# Patient Record
Sex: Female | Born: 1975
Health system: Southern US, Community
[De-identification: ages and names within clinical notes are randomized; demographics above are authoritative.]

## PROBLEM LIST (undated history)

## (undated) DIAGNOSIS — D649 Anemia, unspecified: Secondary | ICD-10-CM

## (undated) DIAGNOSIS — N951 Menopausal and female climacteric states: Secondary | ICD-10-CM

## (undated) DIAGNOSIS — G5603 Carpal tunnel syndrome, bilateral upper limbs: Secondary | ICD-10-CM

## (undated) DIAGNOSIS — E119 Type 2 diabetes mellitus without complications: Secondary | ICD-10-CM

## (undated) DIAGNOSIS — E7800 Pure hypercholesterolemia, unspecified: Secondary | ICD-10-CM

## (undated) HISTORY — DX: Carpal tunnel syndrome, bilateral upper limbs: G56.03

## (undated) HISTORY — DX: Menopausal and female climacteric states: N95.1

## (undated) HISTORY — PX: TUBAL LIGATION: SHX77

## (undated) HISTORY — DX: Pure hypercholesterolemia, unspecified: E78.00

---

## 2005-03-04 ENCOUNTER — Inpatient Hospital Stay (HOSPITAL_COMMUNITY): Admission: AD | Admit: 2005-03-04 | Discharge: 2005-03-04 | Payer: Self-pay | Admitting: Obstetrics

## 2005-03-23 ENCOUNTER — Inpatient Hospital Stay (HOSPITAL_COMMUNITY): Admission: AD | Admit: 2005-03-23 | Discharge: 2005-03-23 | Payer: Self-pay | Admitting: Obstetrics

## 2005-03-26 ENCOUNTER — Inpatient Hospital Stay (HOSPITAL_COMMUNITY): Admission: AD | Admit: 2005-03-26 | Discharge: 2005-03-29 | Payer: Self-pay | Admitting: Obstetrics

## 2006-10-05 ENCOUNTER — Ambulatory Visit: Payer: Self-pay | Admitting: Family Medicine

## 2007-01-05 ENCOUNTER — Ambulatory Visit: Payer: Self-pay | Admitting: Family Medicine

## 2007-09-12 ENCOUNTER — Ambulatory Visit: Payer: Self-pay | Admitting: Family Medicine

## 2007-12-18 ENCOUNTER — Inpatient Hospital Stay (HOSPITAL_COMMUNITY): Admission: AD | Admit: 2007-12-18 | Discharge: 2007-12-19 | Payer: Self-pay | Admitting: Obstetrics & Gynecology

## 2008-01-05 ENCOUNTER — Ambulatory Visit: Payer: Self-pay | Admitting: Obstetrics and Gynecology

## 2008-02-16 ENCOUNTER — Ambulatory Visit: Payer: Self-pay | Admitting: Obstetrics and Gynecology

## 2008-10-30 ENCOUNTER — Encounter (INDEPENDENT_AMBULATORY_CARE_PROVIDER_SITE_OTHER): Payer: Self-pay | Admitting: Family Medicine

## 2008-10-30 ENCOUNTER — Ambulatory Visit: Payer: Self-pay | Admitting: Family Medicine

## 2008-11-06 ENCOUNTER — Ambulatory Visit: Payer: Self-pay | Admitting: *Deleted

## 2009-03-16 ENCOUNTER — Inpatient Hospital Stay (HOSPITAL_COMMUNITY): Admission: AD | Admit: 2009-03-16 | Discharge: 2009-03-16 | Payer: Self-pay | Admitting: Obstetrics and Gynecology

## 2009-04-17 ENCOUNTER — Ambulatory Visit: Payer: Self-pay | Admitting: Obstetrics and Gynecology

## 2009-04-17 LAB — CONVERTED CEMR LAB: Anticardiolipin IgG: 9 (ref <10)

## 2010-04-07 ENCOUNTER — Ambulatory Visit: Payer: Self-pay | Admitting: Internal Medicine

## 2010-05-29 ENCOUNTER — Ambulatory Visit (HOSPITAL_COMMUNITY)
Admission: RE | Admit: 2010-05-29 | Discharge: 2010-05-29 | Payer: Self-pay | Source: Home / Self Care | Attending: Family Medicine | Admitting: Family Medicine

## 2010-08-04 ENCOUNTER — Encounter: Payer: Self-pay | Attending: Obstetrics & Gynecology | Admitting: Dietician

## 2010-08-04 ENCOUNTER — Encounter: Payer: Self-pay | Admitting: Obstetrics & Gynecology

## 2010-08-04 ENCOUNTER — Other Ambulatory Visit: Payer: Self-pay

## 2010-08-04 ENCOUNTER — Other Ambulatory Visit: Payer: Self-pay | Admitting: Obstetrics & Gynecology

## 2010-08-04 DIAGNOSIS — O0930 Supervision of pregnancy with insufficient antenatal care, unspecified trimester: Secondary | ICD-10-CM

## 2010-08-04 DIAGNOSIS — O99810 Abnormal glucose complicating pregnancy: Secondary | ICD-10-CM | POA: Insufficient documentation

## 2010-08-04 DIAGNOSIS — Z713 Dietary counseling and surveillance: Secondary | ICD-10-CM | POA: Insufficient documentation

## 2010-08-04 DIAGNOSIS — O24919 Unspecified diabetes mellitus in pregnancy, unspecified trimester: Secondary | ICD-10-CM

## 2010-08-04 DIAGNOSIS — O09529 Supervision of elderly multigravida, unspecified trimester: Secondary | ICD-10-CM

## 2010-08-04 DIAGNOSIS — O34219 Maternal care for unspecified type scar from previous cesarean delivery: Secondary | ICD-10-CM

## 2010-08-04 LAB — CONVERTED CEMR LAB: Hgb A1c MFr Bld: 5.3 % (ref <5.7)

## 2010-08-04 LAB — POCT URINALYSIS DIPSTICK
Bilirubin Urine: NEGATIVE
Hgb urine dipstick: NEGATIVE
Protein, ur: 30 mg/dL — AB

## 2010-08-05 ENCOUNTER — Encounter: Payer: Self-pay | Admitting: Obstetrics & Gynecology

## 2010-08-06 ENCOUNTER — Encounter (HOSPITAL_COMMUNITY): Payer: Self-pay

## 2010-08-06 ENCOUNTER — Ambulatory Visit (HOSPITAL_COMMUNITY)
Admission: RE | Admit: 2010-08-06 | Discharge: 2010-08-06 | Disposition: A | Discharge status: Home / Self Care | Payer: Self-pay | Source: Ambulatory Visit | Attending: Obstetrics & Gynecology | Admitting: Obstetrics & Gynecology

## 2010-08-06 DIAGNOSIS — O24919 Unspecified diabetes mellitus in pregnancy, unspecified trimester: Secondary | ICD-10-CM

## 2010-08-06 DIAGNOSIS — O99810 Abnormal glucose complicating pregnancy: Secondary | ICD-10-CM | POA: Insufficient documentation

## 2010-08-06 DIAGNOSIS — O3660X0 Maternal care for excessive fetal growth, unspecified trimester, not applicable or unspecified: Secondary | ICD-10-CM | POA: Insufficient documentation

## 2010-08-11 ENCOUNTER — Other Ambulatory Visit: Payer: Self-pay

## 2010-08-11 ENCOUNTER — Other Ambulatory Visit: Payer: Self-pay | Admitting: Family Medicine

## 2010-08-11 DIAGNOSIS — O09529 Supervision of elderly multigravida, unspecified trimester: Secondary | ICD-10-CM

## 2010-08-11 DIAGNOSIS — O99810 Abnormal glucose complicating pregnancy: Secondary | ICD-10-CM

## 2010-08-11 DIAGNOSIS — O350XX0 Maternal care for (suspected) central nervous system malformation in fetus, not applicable or unspecified: Secondary | ICD-10-CM

## 2010-08-11 DIAGNOSIS — O0930 Supervision of pregnancy with insufficient antenatal care, unspecified trimester: Secondary | ICD-10-CM

## 2010-08-11 DIAGNOSIS — O34219 Maternal care for unspecified type scar from previous cesarean delivery: Secondary | ICD-10-CM

## 2010-08-11 LAB — POCT URINALYSIS DIPSTICK
Glucose, UA: NEGATIVE mg/dL
Protein, ur: 30 mg/dL — AB
Specific Gravity, Urine: >=1.03 (ref 1.005–1.030)
Urobilinogen, UA: 0.2 mg/dL (ref 0.0–1.0)
pH: 6 (ref 5.0–8.0)

## 2010-08-18 ENCOUNTER — Ambulatory Visit (HOSPITAL_COMMUNITY): Payer: Self-pay

## 2010-08-18 ENCOUNTER — Other Ambulatory Visit: Payer: Self-pay

## 2010-08-18 DIAGNOSIS — O0930 Supervision of pregnancy with insufficient antenatal care, unspecified trimester: Secondary | ICD-10-CM

## 2010-08-18 DIAGNOSIS — O99810 Abnormal glucose complicating pregnancy: Secondary | ICD-10-CM

## 2010-08-18 DIAGNOSIS — O350XX0 Maternal care for (suspected) central nervous system malformation in fetus, not applicable or unspecified: Secondary | ICD-10-CM

## 2010-08-18 DIAGNOSIS — O34219 Maternal care for unspecified type scar from previous cesarean delivery: Secondary | ICD-10-CM

## 2010-08-18 LAB — POCT URINALYSIS DIPSTICK
Glucose, UA: NEGATIVE mg/dL
Hgb urine dipstick: NEGATIVE
Ketones, ur: NEGATIVE mg/dL
Nitrite: NEGATIVE

## 2010-08-25 ENCOUNTER — Other Ambulatory Visit: Payer: Self-pay | Admitting: Family Medicine

## 2010-08-25 ENCOUNTER — Ambulatory Visit (HOSPITAL_COMMUNITY)
Admission: RE | Admit: 2010-08-25 | Discharge: 2010-08-25 | Disposition: A | Discharge status: Home / Self Care | Payer: Self-pay | Source: Ambulatory Visit | Attending: Family Medicine | Admitting: Family Medicine

## 2010-08-25 ENCOUNTER — Encounter: Payer: Self-pay | Attending: Obstetrics & Gynecology | Admitting: Dietician

## 2010-08-25 ENCOUNTER — Other Ambulatory Visit: Payer: Self-pay

## 2010-08-25 DIAGNOSIS — O99810 Abnormal glucose complicating pregnancy: Secondary | ICD-10-CM

## 2010-08-25 DIAGNOSIS — O3660X0 Maternal care for excessive fetal growth, unspecified trimester, not applicable or unspecified: Secondary | ICD-10-CM | POA: Insufficient documentation

## 2010-08-25 DIAGNOSIS — Z713 Dietary counseling and surveillance: Secondary | ICD-10-CM | POA: Insufficient documentation

## 2010-08-25 DIAGNOSIS — Z3689 Encounter for other specified antenatal screening: Secondary | ICD-10-CM | POA: Insufficient documentation

## 2010-08-25 DIAGNOSIS — O34219 Maternal care for unspecified type scar from previous cesarean delivery: Secondary | ICD-10-CM

## 2010-08-25 LAB — POCT URINALYSIS DIP (DEVICE)
Bilirubin Urine: NEGATIVE
Glucose, UA: NEGATIVE mg/dL
Ketones, ur: NEGATIVE mg/dL
Nitrite: NEGATIVE
Urobilinogen, UA: 0.2 mg/dL (ref 0.0–1.0)

## 2010-09-01 ENCOUNTER — Other Ambulatory Visit: Payer: Self-pay | Admitting: Obstetrics and Gynecology

## 2010-09-01 DIAGNOSIS — O0930 Supervision of pregnancy with insufficient antenatal care, unspecified trimester: Secondary | ICD-10-CM

## 2010-09-01 DIAGNOSIS — O09529 Supervision of elderly multigravida, unspecified trimester: Secondary | ICD-10-CM

## 2010-09-01 DIAGNOSIS — O99810 Abnormal glucose complicating pregnancy: Secondary | ICD-10-CM

## 2010-09-01 DIAGNOSIS — O34219 Maternal care for unspecified type scar from previous cesarean delivery: Secondary | ICD-10-CM

## 2010-09-01 DIAGNOSIS — Z331 Pregnant state, incidental: Secondary | ICD-10-CM

## 2010-09-01 LAB — POCT URINALYSIS DIP (DEVICE)
Bilirubin Urine: NEGATIVE
Nitrite: NEGATIVE
Specific Gravity, Urine: >=1.03 (ref 1.005–1.030)
pH: 5.5 (ref 5.0–8.0)

## 2010-09-08 ENCOUNTER — Other Ambulatory Visit: Payer: Self-pay | Admitting: Obstetrics & Gynecology

## 2010-09-08 ENCOUNTER — Encounter: Payer: Self-pay | Attending: Obstetrics & Gynecology | Admitting: Dietician

## 2010-09-08 DIAGNOSIS — Z713 Dietary counseling and surveillance: Secondary | ICD-10-CM | POA: Insufficient documentation

## 2010-09-08 DIAGNOSIS — O34219 Maternal care for unspecified type scar from previous cesarean delivery: Secondary | ICD-10-CM

## 2010-09-08 DIAGNOSIS — O99810 Abnormal glucose complicating pregnancy: Secondary | ICD-10-CM

## 2010-09-08 LAB — POCT URINALYSIS DIP (DEVICE)
Bilirubin Urine: NEGATIVE
Glucose, UA: NEGATIVE mg/dL
Ketones, ur: NEGATIVE mg/dL
Nitrite: NEGATIVE

## 2010-09-11 LAB — POCT PREGNANCY, URINE: Preg Test, Ur: POSITIVE

## 2010-09-11 LAB — GC/CHLAMYDIA PROBE AMP, GENITAL
Chlamydia, DNA Probe: NEGATIVE
GC Probe Amp, Genital: NEGATIVE

## 2010-09-11 LAB — WET PREP, GENITAL
Clue Cells Wet Prep HPF POC: NONE SEEN
Trich, Wet Prep: NONE SEEN
Yeast Wet Prep HPF POC: NONE SEEN

## 2010-09-11 LAB — HCG, QUANTITATIVE, PREGNANCY: hCG, Beta Chain, Quant, S: 21493 m[IU]/mL — ABNORMAL HIGH (ref <5)

## 2010-09-11 LAB — ABO/RH: ABO/RH(D): O POS

## 2010-09-15 ENCOUNTER — Other Ambulatory Visit: Payer: Self-pay | Admitting: Obstetrics and Gynecology

## 2010-09-15 DIAGNOSIS — O09529 Supervision of elderly multigravida, unspecified trimester: Secondary | ICD-10-CM

## 2010-09-15 DIAGNOSIS — O99810 Abnormal glucose complicating pregnancy: Secondary | ICD-10-CM

## 2010-09-15 DIAGNOSIS — Z331 Pregnant state, incidental: Secondary | ICD-10-CM

## 2010-09-15 DIAGNOSIS — O34219 Maternal care for unspecified type scar from previous cesarean delivery: Secondary | ICD-10-CM

## 2010-09-15 LAB — POCT URINALYSIS DIP (DEVICE)
Glucose, UA: NEGATIVE mg/dL
Hgb urine dipstick: NEGATIVE
pH: 5.5 (ref 5.0–8.0)

## 2010-09-18 ENCOUNTER — Other Ambulatory Visit: Payer: Self-pay

## 2010-09-18 DIAGNOSIS — O99810 Abnormal glucose complicating pregnancy: Secondary | ICD-10-CM

## 2010-09-18 DIAGNOSIS — O09529 Supervision of elderly multigravida, unspecified trimester: Secondary | ICD-10-CM

## 2010-09-22 ENCOUNTER — Other Ambulatory Visit: Payer: Self-pay | Admitting: Obstetrics and Gynecology

## 2010-09-22 ENCOUNTER — Other Ambulatory Visit: Payer: Self-pay | Admitting: Family Medicine

## 2010-09-22 ENCOUNTER — Other Ambulatory Visit: Payer: Self-pay

## 2010-09-22 DIAGNOSIS — Z331 Pregnant state, incidental: Secondary | ICD-10-CM

## 2010-09-22 DIAGNOSIS — N6320 Unspecified lump in the left breast, unspecified quadrant: Secondary | ICD-10-CM

## 2010-09-22 DIAGNOSIS — O99810 Abnormal glucose complicating pregnancy: Secondary | ICD-10-CM

## 2010-09-22 LAB — POCT URINALYSIS DIP (DEVICE)
Glucose, UA: NEGATIVE mg/dL
Ketones, ur: NEGATIVE mg/dL
Protein, ur: NEGATIVE mg/dL
Specific Gravity, Urine: 1.02 (ref 1.005–1.030)
Urobilinogen, UA: 0.2 mg/dL (ref 0.0–1.0)

## 2010-09-24 ENCOUNTER — Ambulatory Visit
Admission: RE | Admit: 2010-09-24 | Discharge: 2010-09-24 | Disposition: A | Discharge status: Home / Self Care | Payer: Self-pay | Source: Ambulatory Visit | Attending: Family Medicine | Admitting: Family Medicine

## 2010-09-24 DIAGNOSIS — N6320 Unspecified lump in the left breast, unspecified quadrant: Secondary | ICD-10-CM

## 2010-09-25 ENCOUNTER — Other Ambulatory Visit: Payer: Self-pay

## 2010-09-25 DIAGNOSIS — Z331 Pregnant state, incidental: Secondary | ICD-10-CM

## 2010-09-25 DIAGNOSIS — O99810 Abnormal glucose complicating pregnancy: Secondary | ICD-10-CM

## 2010-09-25 DIAGNOSIS — O09529 Supervision of elderly multigravida, unspecified trimester: Secondary | ICD-10-CM

## 2010-09-29 ENCOUNTER — Other Ambulatory Visit: Payer: Self-pay

## 2010-09-29 ENCOUNTER — Other Ambulatory Visit: Payer: Self-pay | Admitting: Family Medicine

## 2010-09-29 ENCOUNTER — Other Ambulatory Visit: Payer: Self-pay | Admitting: Obstetrics and Gynecology

## 2010-09-29 DIAGNOSIS — O0930 Supervision of pregnancy with insufficient antenatal care, unspecified trimester: Secondary | ICD-10-CM

## 2010-09-29 DIAGNOSIS — Z331 Pregnant state, incidental: Secondary | ICD-10-CM

## 2010-09-29 DIAGNOSIS — O99810 Abnormal glucose complicating pregnancy: Secondary | ICD-10-CM

## 2010-09-29 DIAGNOSIS — O34219 Maternal care for unspecified type scar from previous cesarean delivery: Secondary | ICD-10-CM

## 2010-09-29 DIAGNOSIS — O24419 Gestational diabetes mellitus in pregnancy, unspecified control: Secondary | ICD-10-CM

## 2010-09-29 LAB — POCT URINALYSIS DIP (DEVICE)
Glucose, UA: 100 mg/dL — AB
Protein, ur: NEGATIVE mg/dL
Specific Gravity, Urine: 1.02 (ref 1.005–1.030)
Urobilinogen, UA: 0.2 mg/dL (ref 0.0–1.0)

## 2010-10-01 ENCOUNTER — Encounter (HOSPITAL_COMMUNITY)
Admission: RE | Admit: 2010-10-01 | Discharge: 2010-10-01 | Disposition: A | Discharge status: Home / Self Care | Payer: Self-pay | Source: Ambulatory Visit | Attending: Obstetrics and Gynecology | Admitting: Obstetrics and Gynecology

## 2010-10-01 DIAGNOSIS — Z01818 Encounter for other preprocedural examination: Secondary | ICD-10-CM | POA: Insufficient documentation

## 2010-10-01 DIAGNOSIS — Z01812 Encounter for preprocedural laboratory examination: Secondary | ICD-10-CM | POA: Insufficient documentation

## 2010-10-01 LAB — BASIC METABOLIC PANEL
CO2: 23 mEq/L (ref 19–32)
Calcium: 9.2 mg/dL (ref 8.4–10.5)
Creatinine, Ser: 0.55 mg/dL (ref 0.4–1.2)
GFR calc non Af Amer: >60 mL/min (ref >60)
Glucose, Bld: 81 mg/dL (ref 70–99)
Potassium: 3.9 mEq/L (ref 3.5–5.1)
Sodium: 136 mEq/L (ref 135–145)

## 2010-10-01 LAB — CBC
Hemoglobin: 13 g/dL (ref 12.0–15.0)
MCHC: 34.1 g/dL (ref 30.0–36.0)
RBC: 3.99 MIL/uL (ref 3.87–5.11)
WBC: 6.3 10*3/uL (ref 4.0–10.5)

## 2010-10-01 LAB — RPR: RPR Ser Ql: NONREACTIVE

## 2010-10-02 ENCOUNTER — Other Ambulatory Visit: Payer: Self-pay

## 2010-10-02 DIAGNOSIS — O09529 Supervision of elderly multigravida, unspecified trimester: Secondary | ICD-10-CM

## 2010-10-02 DIAGNOSIS — O99810 Abnormal glucose complicating pregnancy: Secondary | ICD-10-CM

## 2010-10-02 DIAGNOSIS — Z331 Pregnant state, incidental: Secondary | ICD-10-CM

## 2010-10-06 ENCOUNTER — Other Ambulatory Visit: Payer: Self-pay

## 2010-10-06 ENCOUNTER — Other Ambulatory Visit: Payer: Self-pay | Admitting: Obstetrics & Gynecology

## 2010-10-06 ENCOUNTER — Ambulatory Visit (HOSPITAL_COMMUNITY)
Admission: RE | Admit: 2010-10-06 | Discharge: 2010-10-06 | Disposition: A | Discharge status: Home / Self Care | Payer: Self-pay | Source: Ambulatory Visit | Attending: Obstetrics and Gynecology | Admitting: Obstetrics and Gynecology

## 2010-10-06 DIAGNOSIS — Z3689 Encounter for other specified antenatal screening: Secondary | ICD-10-CM | POA: Insufficient documentation

## 2010-10-06 DIAGNOSIS — O34219 Maternal care for unspecified type scar from previous cesarean delivery: Secondary | ICD-10-CM

## 2010-10-06 DIAGNOSIS — O99810 Abnormal glucose complicating pregnancy: Secondary | ICD-10-CM | POA: Insufficient documentation

## 2010-10-06 DIAGNOSIS — O24419 Gestational diabetes mellitus in pregnancy, unspecified control: Secondary | ICD-10-CM

## 2010-10-06 DIAGNOSIS — Z331 Pregnant state, incidental: Secondary | ICD-10-CM

## 2010-10-06 DIAGNOSIS — O09529 Supervision of elderly multigravida, unspecified trimester: Secondary | ICD-10-CM

## 2010-10-06 LAB — POCT URINALYSIS DIP (DEVICE)
Hgb urine dipstick: NEGATIVE
Ketones, ur: NEGATIVE mg/dL
Protein, ur: NEGATIVE mg/dL
Specific Gravity, Urine: 1.02 (ref 1.005–1.030)
Urobilinogen, UA: 0.2 mg/dL (ref 0.0–1.0)
pH: 5.5 (ref 5.0–8.0)

## 2010-10-07 ENCOUNTER — Inpatient Hospital Stay (HOSPITAL_COMMUNITY)
Admission: AD | Admit: 2010-10-07 | Discharge: 2010-10-07 | Disposition: A | Discharge status: Home / Self Care | Payer: Self-pay | Source: Ambulatory Visit | Attending: Obstetrics & Gynecology | Admitting: Obstetrics & Gynecology

## 2010-10-07 DIAGNOSIS — O368190 Decreased fetal movements, unspecified trimester, not applicable or unspecified: Secondary | ICD-10-CM | POA: Insufficient documentation

## 2010-10-07 DIAGNOSIS — O479 False labor, unspecified: Secondary | ICD-10-CM | POA: Insufficient documentation

## 2010-10-08 ENCOUNTER — Inpatient Hospital Stay (HOSPITAL_COMMUNITY)
Admission: AD | Admit: 2010-10-08 | Discharge: 2010-10-11 | DRG: 765 | Disposition: A | Discharge status: Home / Self Care | Payer: Medicaid Other | Source: Ambulatory Visit | Attending: Obstetrics and Gynecology | Admitting: Obstetrics and Gynecology

## 2010-10-08 ENCOUNTER — Other Ambulatory Visit: Payer: Self-pay | Admitting: Obstetrics and Gynecology

## 2010-10-08 DIAGNOSIS — O34219 Maternal care for unspecified type scar from previous cesarean delivery: Secondary | ICD-10-CM

## 2010-10-08 DIAGNOSIS — Z302 Encounter for sterilization: Secondary | ICD-10-CM

## 2010-10-08 DIAGNOSIS — O0940 Supervision of pregnancy with grand multiparity, unspecified trimester: Secondary | ICD-10-CM

## 2010-10-08 DIAGNOSIS — E119 Type 2 diabetes mellitus without complications: Secondary | ICD-10-CM | POA: Diagnosis present

## 2010-10-08 DIAGNOSIS — O2432 Unspecified pre-existing diabetes mellitus in childbirth: Secondary | ICD-10-CM | POA: Diagnosis present

## 2010-10-08 DIAGNOSIS — Z01812 Encounter for preprocedural laboratory examination: Secondary | ICD-10-CM

## 2010-10-08 DIAGNOSIS — Z01818 Encounter for other preprocedural examination: Secondary | ICD-10-CM

## 2010-10-08 LAB — GLUCOSE, CAPILLARY
Glucose-Capillary: 92 mg/dL (ref 70–99)
Glucose-Capillary: 104 mg/dL — ABNORMAL HIGH (ref 70–99)
Glucose-Capillary: 95 mg/dL (ref 70–99)
Glucose-Capillary: 105 mg/dL — ABNORMAL HIGH (ref 70–99)

## 2010-10-09 LAB — CBC
MCV: 96 fL (ref 78.0–100.0)
Platelets: 96 10*3/uL — ABNORMAL LOW (ref 150–400)
RBC: 3.79 MIL/uL — ABNORMAL LOW (ref 3.87–5.11)
RDW: 13.6 % (ref 11.5–15.5)
WBC: 8.3 10*3/uL (ref 4.0–10.5)

## 2010-10-09 LAB — GLUCOSE, CAPILLARY: Glucose-Capillary: 94 mg/dL (ref 70–99)

## 2010-10-09 NOTE — Op Note (Addendum)
[name], [name]    ACCOUNT NO.:  [id number]  MEDICAL RECORD NO.:  [id number]           PATIENT TYPE:  I  LOCATION:  9103                          [facility]  PHYSICIAN:  Catalina Antigua, MD     DATE OF BIRTH:  1975-10-25  DATE OF PROCEDURE: DATE OF DISCHARGE:                              OPERATIVE REPORT   PREOPERATIVE DIAGNOSES: 1. Intrauterine pregnancy at 39 weeks and 0 days. 2. Previous cesarean section x2. 3. A2 diabetes. 4. Multiparity.  POSTOPERATIVE DIAGNOSES: 1. Intrauterine pregnancy at 39 weeks and 0 days. 2. Previous cesarean section x2. 3. A2 diabetes. 4. Multiparity.  PROCEDURES:  Repeat low transverse cesarean section with bilateral tubal ligation via modified Pomeroy method with Pfannenstiel skin incision.  SURGEON:  Catalina Antigua, MD and Maryelizabeth Kaufmann, MD  ANESTHESIA:  Spinal.  IV FLUIDS:  Two liters.  URINE OUTPUT:  400 mL of clear urine at the end of the procedure.ESTIMATED BLOOD LOSS:  800 mL.  FINDINGS:  A viable female infant in vertex presentation with clear amniotic fluid.  Apgars were 9 and 9, weight was 8 pounds and 4 ounces. Normal uterus and adnexa bilaterally.  SPECIMENS:  Placenta was sent to Labor and Delivery and bilateral tubal segments was sent to Pathology.  COMPLICATIONS:  None immediate.  INDICATIONS:  This is a 35 year old gravida 5, para 2-0-2-2, with an intrauterine pregnancy at 39 weeks and 0 days, history of previous C- section x2, and A2 gestational diabetes, currently controlled on glyburide, presenting for repeat C-section and bilateral tubal ligation. The patient's other past medical history was otherwise unremarkable. Her prenatal history was notable for borderline increased AFP with a normal ultrasound.  The patient was not in labor at the time of admission and her GBS was negative.  PROCEDURE NOTE IN DETAIL:  After informed consent was obtained, the patient was taken back to the operative  suite where spinal anesthesia was placed.  After anesthesia was found to be adequate, the patient was placed in dorsal supine position with a leftward tilt.  The patient was prepped and draped in normal sterile fashion.  After anesthesia again was found to be adequate, a Pfannenstiel skin incision was then made with scalpel and carried down through to the underlying fascia.  The fascia was then incised in the midline.  The fascial incision was then extended laterally with the Mayo scissors.  There was notable to be some mild rectus diastasis.  The superior aspect of the fascial incision was then grasped with a Kocher clamps, elevated, tented up, and the rectus muscles were then dissected off bluntly and sharply due to some previous adhesions.  Attention was then turned inferiorly which in similar fashion was grasped with the Kocher clamps, elevated, tented up.  The rectus muscles were then dissected off bluntly and sharply due to some previous adhesions and scar tissue.  The rectus muscles were then separated in the midline.  The peritoneum was then entered bluntly. After the peritoneal entry, an Alexis O ring retractor was then inserted in place of the bladder blade which was not used.  Then, the lower uterine segment was then incised in transverse fashion with a scalpel. The incision  was extended manually.  Amniotomy was performed and was notable to be of clear fluid.  Infant was noted to be in vertex presentation.  Infant was delivered otherwise atraumatically.  Mouth and nose were bulb suctioned.  Cord was cut and clamped and infant was handed off to awaiting NICU.  Apgars were 9 and 9, weight was 8 pounds 4 ounces.  Cord blood was sampled.  Placenta was delivered spontaneously intact with 3-vessel cord.  The uterus was cleared of all clots and debris.  The uterine incision was then repaired using an 0 Vicryl in a running locking fashion.  There was an additional area that needed  1 figure-of-eight for additional hemostasis.  Attention was then turned to bilateral adnexa which was visualized.  Using a modified Pomeroy method, the right fallopian tube was visualized, grasped with a Babcock clamp, followed out to its fimbriae, and 0 plain gut suture was used and then the tube was then ligated and additional hemostasis was obtained with cautery.  Then in similar fashion, the left fallopian tube was visualized, followed out to its fimbriae, grasped with a Babcock clamp, and 0 plain gut suture was then used and the tube was then ligated and additional hemostasis was then obtained with the electrocautery.  The adnexa was then returned to the intraabdominal cavity.  The peritoneum was then irrigated, cleared of all clots and debris.  The uterine incision was then revisualized and was found to be hemostatic.  The Alexis O ring retractor was then removed.  The fascia was then closed using an 0 Vicryl in a running fashion.  Subcutaneous tissue was then irrigated.  Additional hemostasis was obtained with electrocautery.  The skin was then closed with staples.  The patient tolerated the procedure well.  Lap, needle, and sponge counts were correct x2.  The patient received antibiotics perioperatively.  There were no complications immediately and the patient was taken back to the recovery area in stable condition.    ______________________________ Maryelizabeth Kaufmann, MD   ______________________________ Catalina Antigua, MD    LC/MEDQ  D:  10/08/2010  T:  10/08/2010  Job:  [id number]  Electronically Signed by Catalina Antigua  on 10/09/2010 12:48:21 PM Electronically Signed by Maryelizabeth Kaufmann MD on 10/10/2010 09:13:20 AM

## 2010-10-18 ENCOUNTER — Inpatient Hospital Stay (HOSPITAL_COMMUNITY): Admission: AD | Admit: 2010-10-18 | Payer: Self-pay | Admitting: Obstetrics & Gynecology

## 2010-10-21 NOTE — Discharge Summary (Signed)
  NAMEPALLAS, [name]    ACCOUNT NO.:  [id number]  MEDICAL RECORD NO.:  [id number]           PATIENT TYPE:  LOCATION:                                 FACILITY:  PHYSICIAN:  Catalina Antigua, MD     DATE OF BIRTH:  [birth year]  DATE OF ADMISSION:  10/08/2010 DATE OF DISCHARGE:  10/11/2010                              DISCHARGE SUMMARY   DISCHARGE. DIAGNOSES: 1. Pregnancy status post repeat low transverse C-section. 2. Gestational diabetes.  DISCHARGE MEDICATIONS: 1. Prenatal vitamin. 2. Percocet 5/325 mg by mouth q.4 h. as needed for pain. 3. Motrin 600 mg by mouth q.6 h. as needed for pain and cramps. 4. Colace as needed. 5. Iron sulfate 325 mg by mouth twice daily.  PROCEDURES: 1. Repeat low transverse C-section performed Oct 08, 2010. 2. Bilateral tubal ligation performed Oct 08, 2010.  LABS AND STUDIES:  Hemoglobin on the date of admission was 13.0 with platelets of 115,000, repeat CBC on the day following cesarean demonstrated hemoglobin 12.4, platelets of 96,000.  The patient's CBGs remained well controlled throughout the hospitalization.  The range was 94-104.  BRIEF HOSPITAL COURSE:  This is a 35 year old G3, P3-0-0-3 who [facility].  She presented to the hospital for a repeat low transverse C-section with bilateral tubal ligation at 39 and 2 weeks.  She was taken to the OR and tolerated the procedure well.  The patient had a viable female who weighed 8 pounds 4 ounces.  Apgar's were 9 and 9.  Following the C-section, the patient did well with no evidence of bleeding.  Pain was well controlled on Percocet and Motrin.  The patient's lochia gradually decreased throughout her stay.  As her CBGs remained well controlled following the C-section, her glyburide was discontinued, and her CBGs remained within normal limits.  On hospital day #3, she was felt ready for discharge.  Discharge exam was notable for a low transverse C-section  incision that was healing well, held to get together with staples.  Otherwise discharge exam was unremarkable.  INSTRUCTIONS:  The patient was discharged home with instructions to resume a normal diet, have pelvic rest for 6 weeks, and to return to the health department in 6 weeks for a postpartum check.  FOLLOWUP APPOINTMENTS: 1. A [facility] nurse will see the patient in 5-7 days to remove the     patient's staples. 2. The patient is to return to the health department in 6 weeks for a     postpartum check. 3. The patient is to take her baby to [facility] on August 13, 2010 at 1:15 in the afternoon to be seen by Dr.     Katrinka Blazing.  DISCHARGE CONDITION:  The patient was discharged home in stable medical condition.    ______________________________ Majel Homer, MD   ______________________________ Catalina Antigua, MD    ER/MEDQ  D:  10/11/2010  T:  10/11/2010  Job:  [id number]  cc:   Dr. Katrinka Blazing  Electronically Signed by Manuela Neptune MD on 10/13/2010 07:39:50 PM Electronically Signed by Catalina Antigua  on 10/21/2010 06:24:46 PM

## 2010-10-21 NOTE — Group Therapy Note (Signed)
NAME:  GENEVIENE, TESCH NO.:  [id number]   MEDICAL RECORD NO.:  [id number]          PATIENT TYPE:  WOC   LOCATION:  [facility]                   FAC[facility]   PHYSICIAN:  Argentina Donovan, MD        DATE OF BIRTH:  12-Jun-1975   DATE OF SERVICE:                                  CLINIC NOTE   The patient is a 35 year old Spanish-speaking Hispanic female gravida 3,  para 2-0-1-2 who was seen in the maternity admissions office on the 12th  of this month.  On the 13th took Cytotec for a missed abortion and 3  days later passed some blood with clots.  She said that shortly after  that she had fever and chills, although she could not take her  temperature.  She was dizzy and vomiting for 48 hours and that was last  week.  She comes in today, no further bleeding, not desiring to get  pregnant right away, but using only condoms and decides to stay on  those.  She is on prenatal vitamins and she will continue on those.  We  have talked to her about that with the interpreter, told her we will  recheck her beta HCG.  She has had some headaches since the miscarriage,  check her CBC.  Her hemoglobin was originally 12.6 and let her know  whether we need to do an ultrasound if her count has not dropped enough  and perhaps put her on some iron if her hemoglobin has dropped and if  causing headaches.   IMPRESSION:  Probable complete abortion, pending laboratory   FINDINGS:  We will contact the patient through __________.           ______________________________  Argentina Donovan, MD     PR/MEDQ  D:  01/05/2008  T:  01/06/2008  Job:  [id number]

## 2010-10-24 NOTE — Discharge Summary (Signed)
NAMELEIGHANNE, ADOLPH           ACCOUNT NO.:  [id number]   MEDICAL RECORD NO.:  [id number]          PATIENT TYPE:  INP   LOCATION:  9122                          [facility]   PHYSICIAN:  Kathreen Cosier, M.D.DATE OF BIRTH:  Sep 06, 1975   DATE OF ADMISSION:  03/26/2005  DATE OF DISCHARGE:  03/29/2005                                 DISCHARGE SUMMARY   The patient is a 35 year old gravida 2, para 1-0-0-1, who had a previous C-  section in the past.  She was admitted with ruptured membranes on the  morning of October 19.  She was having irregular contractions, and the  patient failed to progress beyond 7 cm and underwent a repeat low-transverse  cesarean section.  She had a female, Apgars 9 and 9, from the OP position.  Baby weighed 8 pounds 8 ounces.  Postoperatively, she did well.  On  admission, her hemoglobin was 13.4, postoperative 10.4.  Platelets 138, 101.  RPR negative.  Urinalysis negative.  She did well and was discharged on the  third postoperative day and return to regular diet, on Tylox for pain, to  see me in six weeks.   DISCHARGE DIAGNOSIS:  Status post repeat low-transverse cesarean section at  term for failure to progress.           ______________________________  Kathreen Cosier, M.D.     BAM/MEDQ  D:  05/06/2005  T:  05/06/2005  Job:  [id number]

## 2010-10-24 NOTE — H&P (Signed)
NAMEJAYANNA, [name]           ACCOUNT NO.:  [id number]   MEDICAL RECORD NO.:  [id number]          PATIENT TYPE:  INP   LOCATION:  9122                          [facility]   PHYSICIAN:  Kathreen Cosier, M.D.DATE OF BIRTH:  [birth year]   DATE OF ADMISSION:  03/26/2005  DATE OF DISCHARGE:                                HISTORY & PHYSICAL   The patient is a 35 year old gravida 2, para 1-0-0-1, who had a previous  cesarean section for CPD.  She was admitted at 8 a.m. on October 19 with  rupture of membranes which occurred at 3:30 a.m.  Fluid was supposedly  meconium stained.  She was having irregular contractions.  Cervix 2 cm, 90%,  and vertex at -2 to -3.  IUPC was inserted.  She had a negative GBS and she  was started on low dose Pitocin.  By 6 p.m. the patient was 7 cm, molded to  -1 station, and in good labor.  By 10:10 p.m. the cervix was unchanged and  the cervix was now 60% effaced with a thick anterior lip.  It was decided  that she would be delivered by cesarean section for failure to progress in  labor.   PHYSICAL EXAMINATION:  GENERAL:  A well-developed female in labor.  HEENT:  Negative.  LUNGS:  Clear.  HEART:  Regular rhythm, no murmurs and no gallops.  BREASTS:  No masses.  ABDOMEN:  Term size uterus, estimated fetal weight greater than 7 pounds.  EXTREMITIES:  Negative.           ______________________________  Kathreen Cosier, M.D.     BAM/MEDQ  D:  03/26/2005  T:  03/26/2005  Job:  [id number]

## 2010-10-24 NOTE — Op Note (Signed)
NAMEKENNIA, [name]           ACCOUNT NO.:  [id number]   MEDICAL RECORD NO.:  [id number]          PATIENT TYPE:  INP   LOCATION:  9122                          [facility]   PHYSICIAN:  Kathreen Cosier, M.D.DATE OF BIRTH:  Dec 11, 1975   DATE OF PROCEDURE:  03/26/2005  DATE OF DISCHARGE:                                 OPERATIVE REPORT   PREOPERATIVE DIAGNOSIS:  Cephalopelvic disproportion and failure to progress  in labor.   SURGEON:  Kathreen Cosier, M.D.   ANESTHESIA:  Epidural.   DESCRIPTION OF PROCEDURE:  The patient placed on the operating room in the  supine position.  Abdomen prepped and draped.  Bladder emptied with Foley  catheter.  Transverse suprapubic incision made through the old scar and  carried down through the rectus fascia.  The fascia was cleaned and incised  the length of the incision.  Rectus muscles were retracted laterally.  The  peritoneum was incised longitudinally.  Transverse incision made in the  vesicouterine peritoneum above the bladder.  The bladder mobilized  inferiorly.  Transverse lower uterine incision made.  The patient delivered  from the OP position of a female, Apgars 9 and 9, weighing 8 pounds 8  ounces.  The team was in attendance.  Fluid was clear.  Placenta was  posterior and removed manually.  Uterine cavity was cleaned with dry laps.  Uterine incision closed in one layer with continuous suture of #1 chromic.  Hemostasis was satisfactory.  Bladder flap reattached with 2-0 chromic.  Uterus was well contracted.  Tubes and ovaries normal.  Abdomen closed in  layers.  Peritoneum with continuous suture of 0 chromic, fascia with  continuous suture of 0 Dexon and skin closed with subcuticular stitch of 4-0  Monocryl.  Blood loss 500 mL.           ______________________________  Kathreen Cosier, M.D.     BAM/MEDQ  D:  03/26/2005  T:  03/27/2005  Job:  [id number]

## 2010-10-27 IMAGING — US US OB TRANSVAGINAL MODIFY
1 series · 14 of 28 positions shown · non-contrast
Comparison: None available for this pregnancy.

CLINICAL DATA: Gestational age 10 weeks and 3 days by LMP.  Pelvic
pain and vaginal bleeding.

OBSTETRIC <14 WK US AND TRANSVAGINAL OB US
TECHNIQUE: Both transabdominal and transvaginal ultrasound
examinations were performed for complete evaluation of the
gestation as well as the maternal uterus, adnexal regions, and
pelvic cul-de-sac.

[Series 1: us ob comp less 14 wks · 0.20mm/px · 14 of 48 slices shown]
[im 2/48]
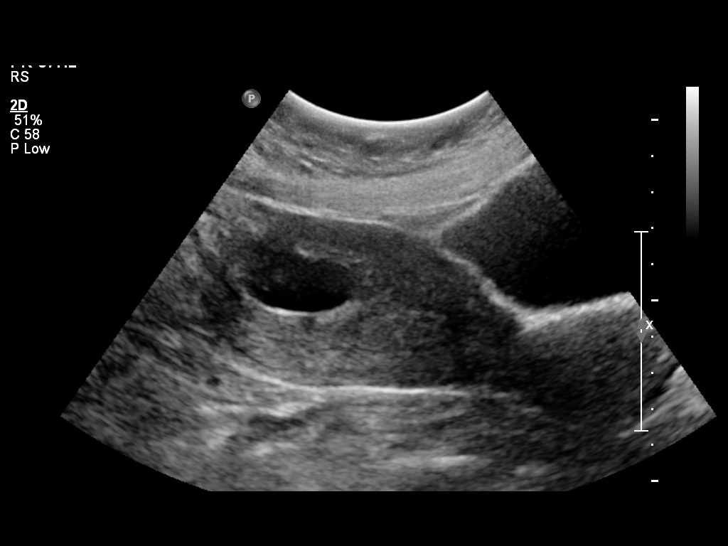
[im 6/48]
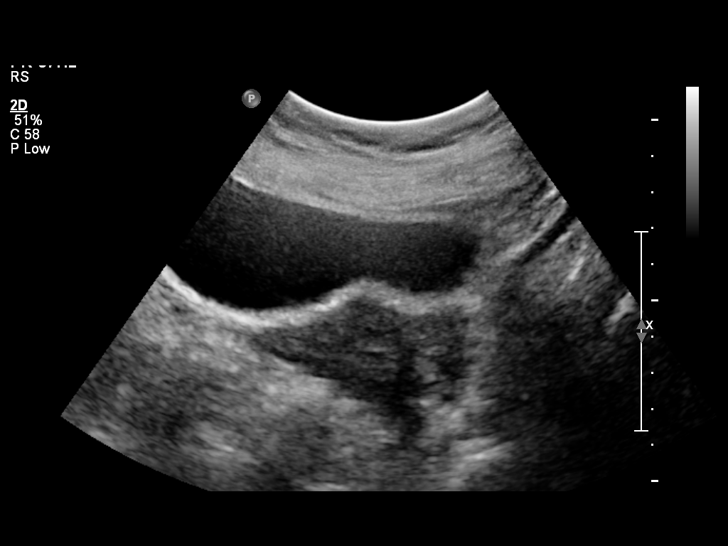
[im 9/48]
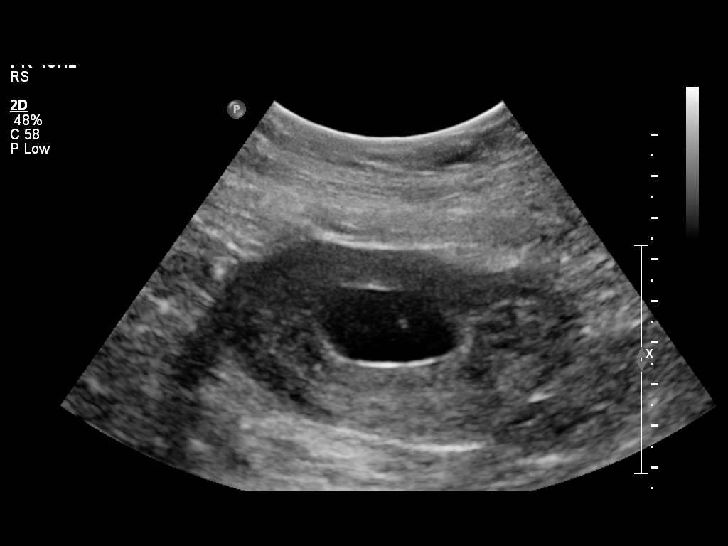
[im 13/48]
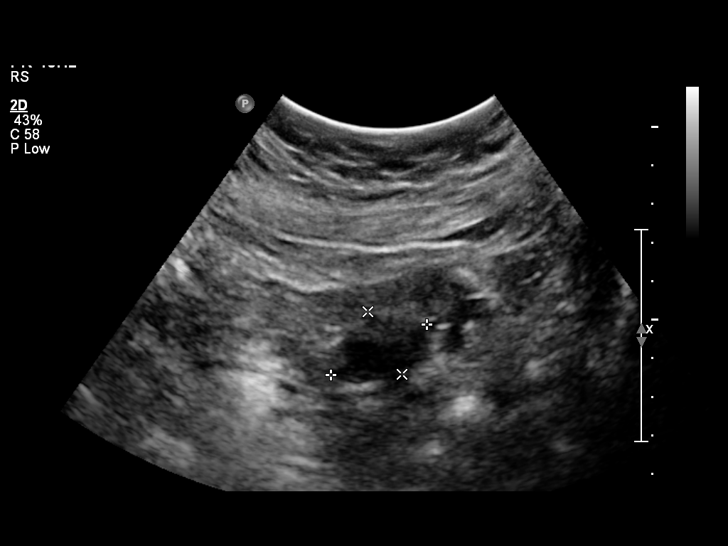
[im 16/48]
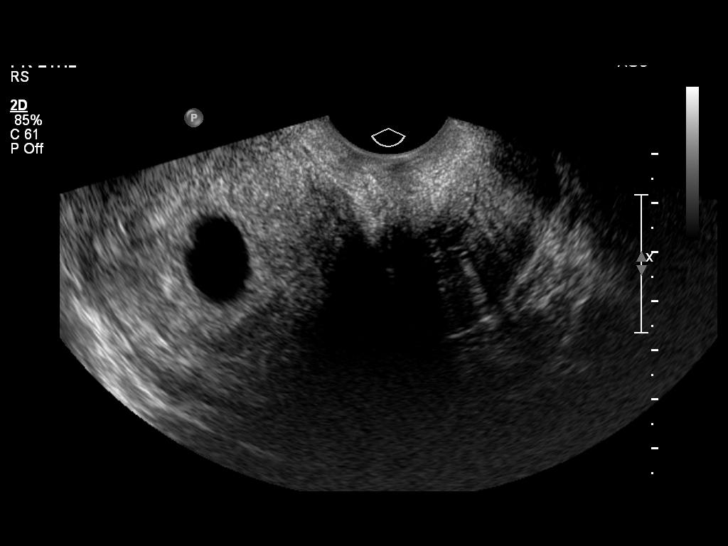
[im 20/48]
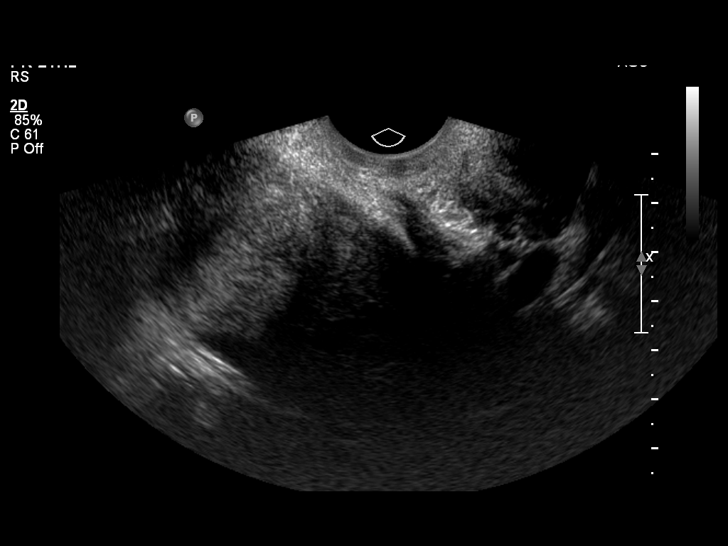
[im 23/48]
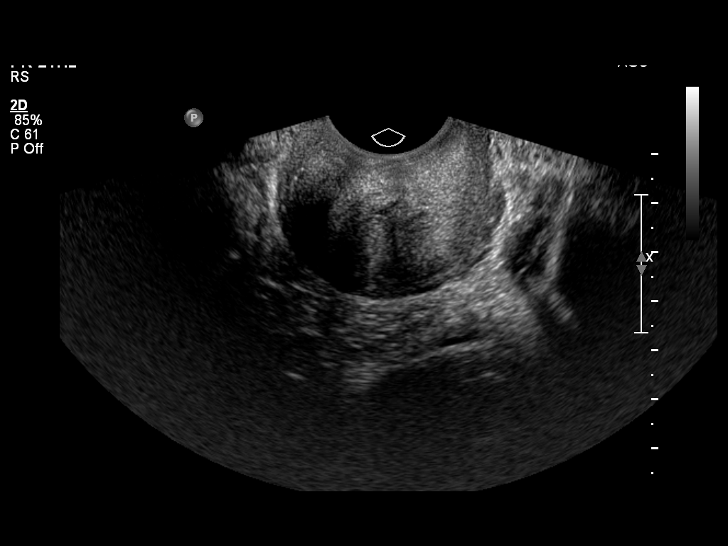
[im 27/48]
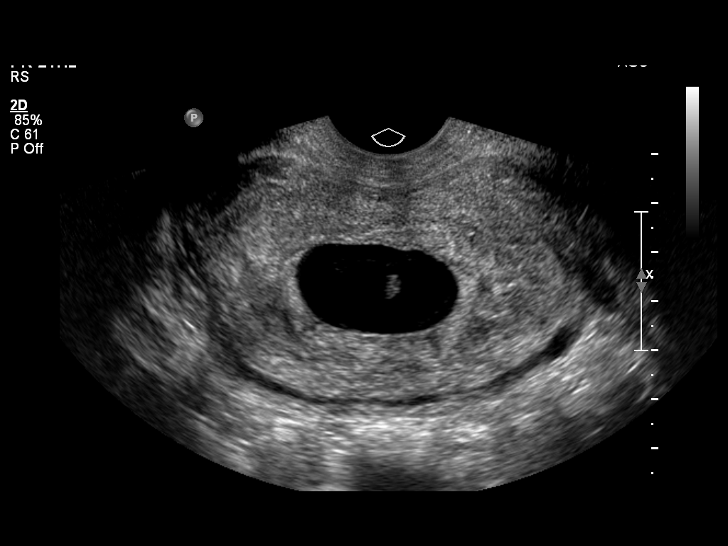
[im 30/48]
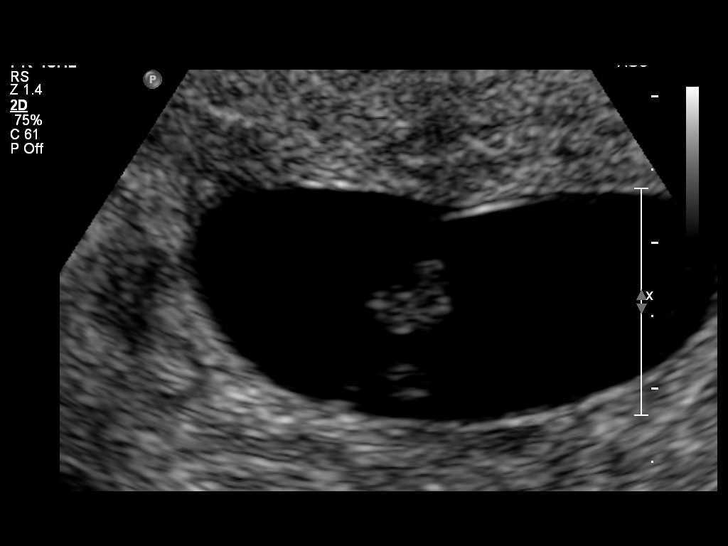
[im 34/48]
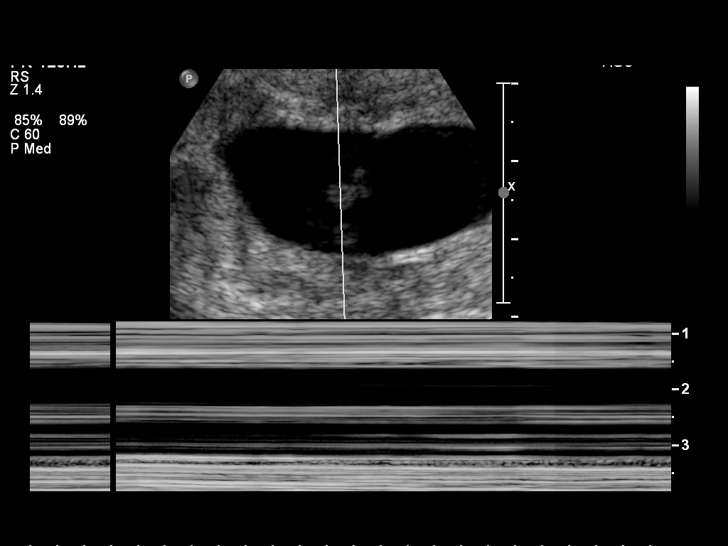
[im 37/48]
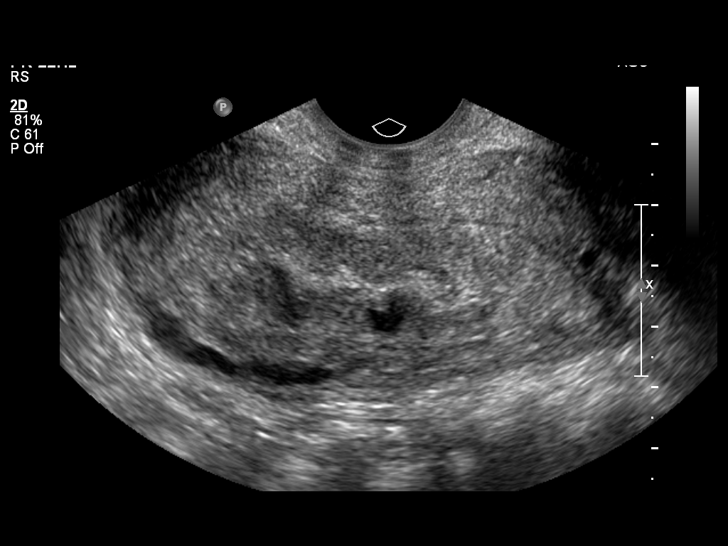
[im 41/48]
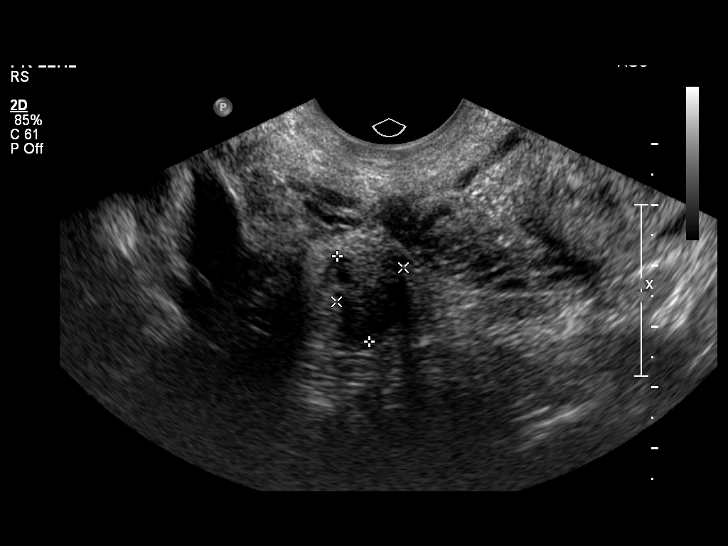
[im 44/48]
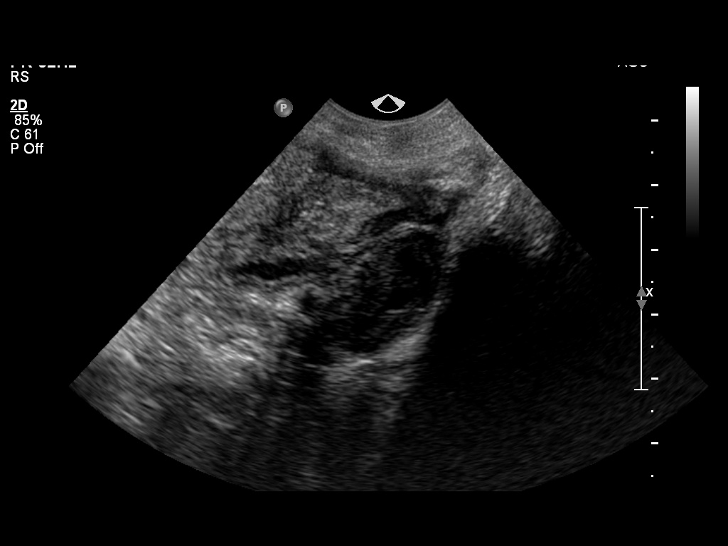
[im 48/48]
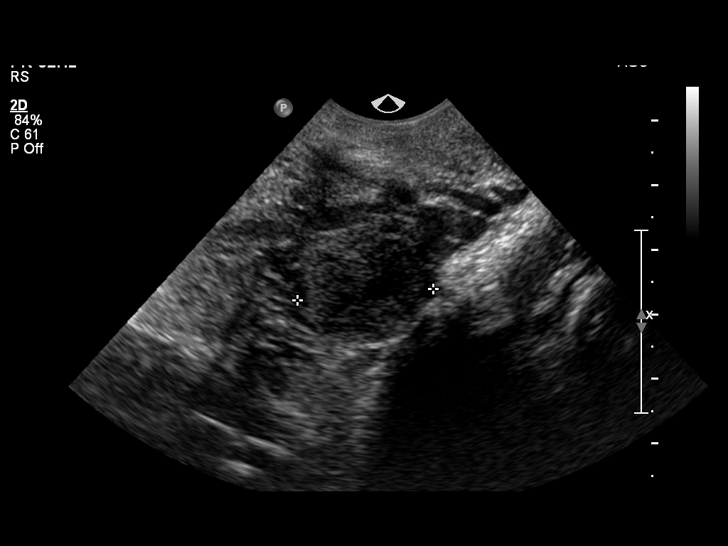

[14 of 28 positions shown; findings below may reference images not displayed]

Intrauterine gestational sac: Single
Yolk sac: Present
Embryo: Present
Cardiac Activity: No cardiac activity is evident.

CRL: 6.1           6   w  3   d           US EDC:

Maternal uterus/adnexae:
A small subchorionic hemorrhages evident.  The ovaries are within
normal limits bilaterally.  A dominant to the cystic lesion in the
left ovary is compatible with a corpus luteal cyst.
IMPRESSION: 1.  Crown-rump length disproportionate to age and absence of
cardiac activity is compatible with a spontaneous missed abortion.
2.  Small subchorionic hemorrhage.

## 2010-11-05 ENCOUNTER — Ambulatory Visit: Payer: Self-pay | Admitting: Obstetrics and Gynecology

## 2010-11-05 ENCOUNTER — Other Ambulatory Visit: Payer: Self-pay | Admitting: Obstetrics and Gynecology

## 2010-11-06 NOTE — Group Therapy Note (Signed)
[name], [name]    ACCOUNT NO.:  [id number]  MEDICAL RECORD NO.:  [id number]           PATIENT TYPE:  A  LOCATION:  [facility]                   FACILITY:  [facility]  PHYSICIAN:  Caren Griffins, CNM       DATE OF BIRTH:  September 27, 1975  DATE OF SERVICE:  11/05/2010                                 CLINIC NOTE  REASON FOR VISIT:  Postpartum postop visit.  HISTORY:  This is a 35 year old G5, P3-0-2-3 who is about 28 days status post scheduled repeat LTCS with BTL.  However, she was unaware whether she did have a tubal.  I have therefore reviewed the pathology report with her, and she understands that she did have a tubal.  She is doing well and has no complaints.  She is still having scant amount of dark brownish red blood.  She has not resumed sexual relations.  The baby is doing well and weighs 8 pounds, birth weight was documented as 8 pounds 4 ounces.  She is breast-feeding without supplementation is having no problems with that.  She does have help at home.  She is not employed outside the home.  She did the [place] postpartum depression scale and all is well.  She is not depressed.  Of note, she was on A2 gestational diabetic, on glyburide, and had borderline control of her sugars.  Her last Pap smear was here and it was negative.  However, there was no EC transformation zone component and that was on May 28, 2010.  OBJECTIVE:  VITAL SIGNS:  Temperature 98.6, pulse 91, BP 114/70.  Weight 160.4, height 5 feet 1 inch.  Her weight is about 15 pounds less than at the time of delivery. GENERAL:  WN, WD Hispanic female in NAD.  Veryl Speak Royal is here as Equities trader. NECK:  No thyromegaly. BREASTS:  Soft, nipples erect.  No masses.  They are lactational. ABDOMEN:  Soft, obese, and nontender.  There is about a 3-cm diastasis rectus in midline and this is reviewed with the patient to begin doing partial sit-ups.  Fundus is palpable 3 fingers above SP and nontender. Her  Pfannenstiel incision is completely healed and looks fine. EXTREMITIES:  Without varicosities.  Pulses full and equal bilaterally. HEART:  RRR without murmur. LUNGS:  CTA bilateral. PELVIC:  NEFG.  Vagina with scant amount of dark blood in vault.  Cervix clean.  Pap done.  Os is nulliparous.  Bimanual:  No masses noted. However, exam is difficult due to body habitus.  Uterus seat does seem slightly enlarged consistent with 4 weeks post delivery.  ASSESSMENT/PLAN:  Four weeks' postpartum, tertiary C-section and BTL doing well.  We did review with data to make sure that she did indeed wants a tubal and that she understands that she did have a tubal.  She is advised that she can resume sexual relations whenever she is ready, and we will be notifying her by mail regarding the results of her Pap, and we will call her if her hemoglobin A1c is abnormal.  She is advised to exercise eat a nutritious diet and lose weight if she can.          ______________________________ Caren Griffins, CNM  DP/MEDQ  D:  11/05/2010  T:  11/06/2010  Job:  [id number]

## 2011-03-05 LAB — CBC
MCV: 94.8
Platelets: 159
RBC: 3.81 — ABNORMAL LOW
WBC: 6.2

## 2011-03-05 LAB — ABO/RH: ABO/RH(D): O POS

## 2011-03-05 LAB — HCG, SERUM, QUALITATIVE: Preg, Serum: POSITIVE — AB

## 2011-03-05 LAB — GC/CHLAMYDIA PROBE AMP, GENITAL
Chlamydia, DNA Probe: NEGATIVE
GC Probe Amp, Genital: NEGATIVE

## 2011-03-05 LAB — WET PREP, GENITAL: Clue Cells Wet Prep HPF POC: NONE SEEN

## 2011-11-26 ENCOUNTER — Other Ambulatory Visit: Payer: Self-pay | Admitting: Family Medicine

## 2012-10-21 ENCOUNTER — Encounter (HOSPITAL_COMMUNITY): Payer: Self-pay

## 2012-10-21 ENCOUNTER — Inpatient Hospital Stay (HOSPITAL_COMMUNITY)
Admission: AD | Admit: 2012-10-21 | Discharge: 2012-10-21 | Disposition: A | Discharge status: Home / Self Care | Payer: Self-pay | Source: Ambulatory Visit | Attending: Obstetrics & Gynecology | Admitting: Obstetrics & Gynecology

## 2012-10-21 DIAGNOSIS — N61 Mastitis without abscess: Secondary | ICD-10-CM | POA: Insufficient documentation

## 2012-10-21 DIAGNOSIS — N611 Abscess of the breast and nipple: Secondary | ICD-10-CM

## 2012-10-21 DIAGNOSIS — N644 Mastodynia: Secondary | ICD-10-CM | POA: Insufficient documentation

## 2012-10-21 HISTORY — DX: Type 2 diabetes mellitus without complications: E11.9

## 2012-10-21 MED ORDER — CEPHALEXIN 500 MG PO CAPS: 500.0000 mg | ORAL_CAPSULE | Freq: Four times a day (QID) | ORAL | Status: DC

## 2012-10-21 MED ORDER — IBUPROFEN 600 MG PO TABS: 600.0000 mg | ORAL_TABLET | Freq: Four times a day (QID) | ORAL | Status: DC | PRN

## 2012-10-21 NOTE — MAU Provider Note (Signed)
  History     CSN: [id number]  Arrival date and time: 10/21/12 8295   None     Chief Complaint  Patient presents with  . Cyst on breast    HPI 37 y.o. [id number] here with painful area in left breast, swelling, intermittent fever/chills. Not breastfeeding.   Past Medical History  Diagnosis Date  . Diabetes mellitus without complication     Past Surgical History  Procedure Laterality Date  . Cesarean section      Family History  Problem Relation Age of Onset  . Hypertension Mother   . Diabetes Mother     History  Substance Use Topics  . Smoking status: Never Smoker   . Smokeless tobacco: Not on file  . Alcohol Use: No    Allergies: No Known Allergies  No prescriptions prior to admission    Review of Systems  Constitutional: Negative.   Respiratory: Negative.   Cardiovascular: Negative.   Gastrointestinal: Negative for nausea, vomiting, abdominal pain, diarrhea and constipation.  Genitourinary: Negative for dysuria, urgency, frequency, hematuria and flank pain.       Negative for vaginal bleeding  Musculoskeletal: Negative.   Neurological: Negative.   Psychiatric/Behavioral: Negative.    Physical Exam   Blood pressure 116/78, pulse 79, temperature 98.4 F (36.9 C), temperature source Oral, resp. rate 16, height 5\' 1"  (1.549 m), weight 171 lb 2 oz (77.622 kg), unknown if currently breastfeeding.  Physical Exam  Nursing note and vitals reviewed. Constitutional: She is oriented to person, place, and time. She appears well-developed and well-nourished. No distress.  Cardiovascular: Normal rate.   Respiratory: Effort normal. No respiratory distress.    Musculoskeletal: Normal range of motion.  Neurological: She is alert and oriented to person, place, and time.  Skin: Skin is warm and dry.  Psychiatric: She has a normal mood and affect.    MAU Course  Procedures Results for orders placed during the hospital encounter of 10/21/12 (from the past 24  hour(s))  POCT PREGNANCY, URINE     Status: None   Collection Time    10/21/12  8:49 AM      Result Value Range   Preg Test, Ur NEGATIVE  NEGATIVE   Dr. Burnice LoganKatrinka Blazing consulted for I & D of abscess.   Assessment and Plan  37 y.o. [phone] with abscess of left breast - I & D today  Rx Keflex 500 mg QID x 7 days F/U in clinic on Monday     Medication List    TAKE these medications       cephALEXin 500 MG capsule  Commonly known as:  KEFLEX  Take 1 capsule (500 mg total) by mouth 4 (four) times daily.     ibuprofen 600 MG tablet  Commonly known as:  ADVIL,MOTRIN  Take 1 tablet (600 mg total) by mouth every 6 (six) hours as needed for pain.            Follow-up Information   Follow up with [facility] On 10/24/2012. (1 PM with Dr. Erin Fulling)    Contact information:   [address] [place] [address] [phone]        [facility] 10/21/2012, 1:01 PM

## 2012-10-21 NOTE — MAU Note (Signed)
Pt states via Eda, spanish translator that she has had cyst between her breasts, this occurred while she was pregnant, has workup done, all wnl. Here today with inflammed breast tissue, intermittent fever/chills.

## 2012-10-22 NOTE — MAU Provider Note (Signed)
I was called to see pt for left breast abscess.  The lesion was mid breast and pointing. It was draining purulent discharge.  Procedure: the area was cleaned with betadine and 3cc of lidocaine was injected then, using a #11 blade the abscess was opened and drained.  The lesion was packed with iodoform gauze and dressing was applied.  Pt tolerated the procedure well. Interpretation was with [place] the spanish interpreter.  She will f/u with me in 2 days.   Philamena Kramar L. Harraway-Smith, M.D., [place] [place]

## 2012-10-24 ENCOUNTER — Ambulatory Visit (INDEPENDENT_AMBULATORY_CARE_PROVIDER_SITE_OTHER): Payer: Self-pay | Admitting: Obstetrics & Gynecology

## 2012-10-24 ENCOUNTER — Encounter: Payer: Self-pay | Admitting: Obstetrics & Gynecology

## 2012-10-24 VITALS — BP 118/72 | HR 88 | Temp 97.5°F | Ht 61.0 in | Wt 172.5 lb

## 2012-10-24 DIAGNOSIS — N611 Abscess of the breast and nipple: Secondary | ICD-10-CM

## 2012-10-24 DIAGNOSIS — N61 Mastitis without abscess: Secondary | ICD-10-CM

## 2012-10-24 NOTE — Progress Notes (Signed)
Subjective:     Patient ID: [name], female   DOB: 20-Jan-1976, 37 y.o.   MRN: [id number]  HPI  Pt reports that her pain has greatly improved.  She denies fever or chills.  Past Medical History  Diagnosis Date  . Diabetes mellitus without complication    Past Surgical History  Procedure Laterality Date  . Cesarean section     Current Outpatient Prescriptions on File Prior to Visit  Medication Sig Dispense Refill  . cephALEXin (KEFLEX) 500 MG capsule Take 1 capsule (500 mg total) by mouth 4 (four) times daily.  28 capsule  0  . ibuprofen (ADVIL,MOTRIN) 600 MG tablet Take 1 tablet (600 mg total) by mouth every 6 (six) hours as needed for pain.  30 tablet  1   No current facility-administered medications on file prior to visit.  No Known Allergies History   Social History  . Marital Status: Married    Spouse Name: N/A    Number of Children: N/A  . Years of Education: N/A   Occupational History  . Not on file.   Social History Main Topics  . Smoking status: Never Smoker   . Smokeless tobacco: Not on file  . Alcohol Use: No  . Drug Use: No  . Sexually Active: Not on file   Other Topics Concern  . Not on file   Social History Narrative  . No narrative on file      Review of Systems     Objective:   Physical Exam BP 118/72  Pulse 88  Temp(Src) 97.5 F (36.4 C) (Oral)  Ht 5\' 1"  (1.549 m)  Wt 172 lb 8 oz (78.245 kg)  BMI 32.61 kg/m2  LMP 10/21/2012  Pt in NAD Breast Left: packing removed from abscess site.  Area cleaned with Betadine and repacked with sterile guaze.  Patient tolerated without difficulty       Assessment:     Breast abscess- s/p I&D Imporved      Plan:     F/u 2 days or sooner prn Keep po atbx

## 2012-10-24 NOTE — Patient Instructions (Signed)
Abscess Care After An abscess (also called a boil or furuncle) is an infected area that contains a collection of pus. Signs and symptoms of an abscess include pain, tenderness, redness, or hardness, or you may feel a moveable soft area under your skin. An abscess can occur anywhere in the body. The infection may spread to surrounding tissues causing cellulitis. A cut (incision) by the surgeon was made over your abscess and the pus was drained out. Gauze may have been packed into the space to provide a drain that will allow the cavity to heal from the inside outwards. The boil may be painful for 5 to 7 days. Most people with a boil do not have high fevers. Your abscess, if seen early, may not have localized, and may not have been lanced. If not, another appointment may be required for this if it does not get better on its own or with medications. HOME CARE INSTRUCTIONS   Only take over-the-counter or prescription medicines for pain, discomfort, or fever as directed by your caregiver.  When you bathe, soak and then remove gauze or iodoform packs at least daily or as directed by your caregiver. You may then wash the wound gently with mild soapy water. Repack with gauze or do as your caregiver directs. SEEK IMMEDIATE MEDICAL CARE IF:   You develop increased pain, swelling, redness, drainage, or bleeding in the wound site.  You develop signs of generalized infection including muscle aches, chills, fever, or a general ill feeling.  An oral temperature above 102 F (38.9 C) develops, not controlled by medication. See your caregiver for a recheck if you develop any of the symptoms described above. If medications (antibiotics) were prescribed, take them as directed. Document Released: 12/11/2004 Document Revised: 08/17/2011 Document Reviewed: 08/08/2007 [facility] Patient Information 2013 [place], [place]. Absceso (Abscess)  Un absceso es una zona infectada que contiene pus y desechos.Puede aparecer en  cualquier parte del cuerpo. Tambin se lo conoce como fornculo o divieso. CAUSAS  Ocurre cuando los tejidos se infectan. Tambin puede formarse por obstruccin de las glndulas sebceas o las glndulas sudorparas, infeccin de los folculos pilosos o por una lesin pequea en la piel. A medida que el organismo lucha contra la infeccin, se acumula pus en la zona y hace presin debajo de la piel. Esta presin causa dolor. Las personas con un sistema inmunolgico debilitado tienen dificultad para Industrial/product designer las infecciones y pueden formar abscesos con ms frecuencia.  SNTOMAS  Generalmente un absceso se forma sobre la piel y se vuelve una masa dolorosa, roja, caliente y sensible. Si se forma debajo de la piel, podr sentir como una zona blanda, que se [place], debajo de la piel. Algunos abscesos se abren (ruptura) por s mismos, pero la mayora seguir empeorando si no se lo trata. La infeccin puede diseminarse hacia otros sitios del cuerpo y finalmente al torrente sanguneo y hace que el enfermo se sienta mal.  DIAGNSTICO  El mdico le har una historia clnica y un examen fsico. Podrn tomarle Lauris Poag de lquido del absceso y Public librarian para Clinical research associate la causa de la infeccin. .  TRATAMIENTO  El mdico le indicar antibiticos para combatir la infeccin. Sin embargo, el uso de antibiticos solamente no curar el absceso. El mdico tendr que hacer un pequeo corte (incisin) en el absceso para drenar el pus. En algunos casos se introduce una gasa en el absceso para reducir Chief Technology Officer y que siga drenando la zona.  INSTRUCCIONES PARA EL CUIDADO EN EL HOGAR  Solo tome medicamentos de venta libre o recetados para Chief Technology Officer, Dentist o fiebre, segn las indicaciones del mdico.  Si le han recetado antibiticos, tmelos segn las indicaciones. Tmelos todos, aunque se sienta mejor.  Si le aplicaron una gasa, siga las indicaciones del mdico para [place].  Para evitar la propagacin de la  infeccin:  Mantenga el absceso cubierto con el vendaje.  Lvese bien las manos.  No comparta artculos de cuidado personal, toallas o jacuzzis con los dems.  Evite el contacto con la piel de Economist.  Mantenga la piel y la ropa limpia alrededor del absceso.  Cumpla con todas las visitas de control, segn le indique su mdico. SOLICITE ATENCIN MDICA SI:   Aumenta el dolor, la hinchazn, el enrojecimiento, drena lquido o sangra.  Siente dolores musculares, escalofros, o una sensacin general de Dentist.  Tiene fiebre. ASEGRESE DE QUE:   Comprende estas instrucciones.  Controlar su enfermedad.  Solicitar ayuda de inmediato si no mejora o si empeora. Document Released: 05/25/2005 Document Revised: 11/24/2011 [facility] Patient Information 2013 [place], [place].

## 2012-10-26 ENCOUNTER — Encounter: Payer: Self-pay | Admitting: Obstetrics & Gynecology

## 2012-10-26 ENCOUNTER — Ambulatory Visit (INDEPENDENT_AMBULATORY_CARE_PROVIDER_SITE_OTHER): Payer: Self-pay | Admitting: Obstetrics & Gynecology

## 2012-10-26 VITALS — Temp 98.2°F | Ht 61.0 in | Wt 174.1 lb

## 2012-10-26 DIAGNOSIS — N61 Mastitis without abscess: Secondary | ICD-10-CM

## 2012-10-26 DIAGNOSIS — N611 Abscess of the breast and nipple: Secondary | ICD-10-CM

## 2012-10-26 NOTE — Patient Instructions (Addendum)
Absceso  (Abscess)   Un absceso es una zona infectada que contiene pus y desechos. Puede aparecer en cualquier parte del cuerpo. También se lo conoce como forúnculo o divieso.  CAUSAS   Ocurre cuando los tejidos se infectan. También puede formarse por obstrucción de las glándulas sebáceas o las glándulas sudoríparas, infección de los folículos pilosos o por una lesión pequeña en la piel. A medida que el organismo lucha contra la infección, se acumula pus en la zona y hace presión debajo de la piel. Esta presión causa dolor. Las personas con un sistema inmunológico debilitado tienen dificultad para luchar contra las infecciones y pueden formar abscesos con más frecuencia.   SÍNTOMAS   Generalmente un absceso se forma sobre la piel y se vuelve una masa dolorosa, roja, caliente y sensible. Si se forma debajo de la piel, podrá sentir como una zona blanda, que se mueve, debajo de la piel. Algunos abscesos se abren (ruptura) por sí mismos, pero la mayoría seguirá empeorando si no se lo trata. La infección puede diseminarse hacia otros sitios del cuerpo y finalmente al torrente sanguíneo y hace que el enfermo se sienta mal.   DIAGNÓSTICO   El médico le hará una historia clínica y un examen físico. Podrán tomarle una muestra de líquido del absceso y analizarlo para encontrar la causa de la infección. .   TRATAMIENTO   El médico le indicará antibióticos para combatir la infección. Sin embargo, el uso de antibióticos solamente no curará el absceso. El médico tendrá que hacer un pequeño corte (incisión) en el absceso para drenar el pus. En algunos casos se introduce una gasa en el absceso para reducir el dolor y que siga drenando la zona.   INSTRUCCIONES PARA EL CUIDADO EN EL HOGAR   · Solo tome medicamentos de venta libre o recetados para el dolor, malestar o fiebre, según las indicaciones del médico.  · Si le han recetado antibióticos, tómelos según las indicaciones. Tómelos todos, aunque se sienta mejor.  · Si le  aplicaron una gasa, siga las indicaciones del médico para cambiarla.  · Para evitar la propagación de la infección:  · Mantenga el absceso cubierto con el vendaje.  · Lávese bien las manos.  · No comparta artículos de cuidado personal, toallas o jacuzzis con los demás.  · Evite el contacto con la piel de otras personas.  · Mantenga la piel y la ropa limpia alrededor del absceso.  · Cumpla con todas las visitas de control, según le indique su médico.  SOLICITE ATENCIÓN MÉDICA SI:   · Aumenta el dolor, la hinchazón, el enrojecimiento, drena líquido o sangra.  · Siente dolores musculares, escalofríos, o una sensación general de malestar.  · Tiene fiebre.  ASEGÚRESE DE QUE:   · Comprende estas instrucciones.  · Controlará su enfermedad.  · Solicitará ayuda de inmediato si no mejora o si empeora.  Document Released: 05/25/2005 Document Revised: 11/24/2011  Exit[employer]® Patient Information ©2014 [employer].

## 2012-10-26 NOTE — Progress Notes (Signed)
Subjective:     Patient ID: [name], female   DOB: 07-20-1975, 37 y.o.   MRN: [id number]  HPI pt with no complaints.  Pain improved.  Changed outer dressing once yesterday.  Denies fever and/or chills.   Review of Systems     Objective:   Physical Exam Temp(Src) 98.2 F (36.8 C)  Ht 5\' 1"  (1.549 m)  Wt 174 lb 1.6 oz (78.971 kg)  BMI 32.91 kg/m2  LMP 10/21/2012 Pt in NAD Left breast: packing removed.  Very shallow abscess site at present.  No erythema noted.  No drainage noted.  Packing not replaced.       Assessment:     Breast abscess - improved after I&D    Plan:    continue ATBX until rx completed Keep area clean and dry  F/u prn

## 2014-04-09 ENCOUNTER — Encounter: Payer: Self-pay | Admitting: Obstetrics & Gynecology

## 2017-07-07 ENCOUNTER — Emergency Department (HOSPITAL_COMMUNITY)
Admission: EM | Admit: 2017-07-07 | Discharge: 2017-07-07 | Disposition: A | Discharge status: Home / Self Care | Payer: Self-pay | Attending: Emergency Medicine | Admitting: Emergency Medicine

## 2017-07-07 ENCOUNTER — Other Ambulatory Visit: Payer: Self-pay

## 2017-07-07 ENCOUNTER — Encounter (HOSPITAL_COMMUNITY): Payer: Self-pay

## 2017-07-07 DIAGNOSIS — G9009 Other idiopathic peripheral autonomic neuropathy: Secondary | ICD-10-CM | POA: Insufficient documentation

## 2017-07-07 DIAGNOSIS — G629 Polyneuropathy, unspecified: Secondary | ICD-10-CM

## 2017-07-07 DIAGNOSIS — Z79899 Other long term (current) drug therapy: Secondary | ICD-10-CM | POA: Insufficient documentation

## 2017-07-07 DIAGNOSIS — R21 Rash and other nonspecific skin eruption: Secondary | ICD-10-CM | POA: Insufficient documentation

## 2017-07-07 DIAGNOSIS — R739 Hyperglycemia, unspecified: Secondary | ICD-10-CM | POA: Insufficient documentation

## 2017-07-07 LAB — BASIC METABOLIC PANEL
ANION GAP: 12 (ref 5–15)
BUN: 14 mg/dL (ref 6–20)
CO2: 19 mmol/L — ABNORMAL LOW (ref 22–32)
Calcium: 8.7 mg/dL — ABNORMAL LOW (ref 8.9–10.3)
Chloride: 100 mmol/L — ABNORMAL LOW (ref 101–111)
Creatinine, Ser: 0.76 mg/dL (ref 0.44–1.00)
GLUCOSE: 291 mg/dL — AB (ref 65–99)
POTASSIUM: 3.6 mmol/L (ref 3.5–5.1)
Sodium: 131 mmol/L — ABNORMAL LOW (ref 135–145)

## 2017-07-07 LAB — CBC WITH DIFFERENTIAL/PLATELET
BASOS ABS: 0 10*3/uL (ref 0.0–0.1)
BASOS PCT: 0 %
Eosinophils Absolute: 0.2 10*3/uL (ref 0.0–0.7)
Eosinophils Relative: 3 %
HEMATOCRIT: 34.5 % — AB (ref 36.0–46.0)
HEMOGLOBIN: 11.2 g/dL — AB (ref 12.0–15.0)
LYMPHS PCT: 4 %
Lymphs Abs: 0.3 10*3/uL — ABNORMAL LOW (ref 0.7–4.0)
MCH: 24.2 pg — ABNORMAL LOW (ref 26.0–34.0)
MCHC: 32.5 g/dL (ref 30.0–36.0)
MCV: 74.7 fL — AB (ref 78.0–100.0)
MONO ABS: 0.2 10*3/uL (ref 0.1–1.0)
Monocytes Relative: 2 %
NEUTROS ABS: 6 10*3/uL (ref 1.7–7.7)
NEUTROS PCT: 91 %
Platelets: 159 10*3/uL (ref 150–400)
RBC: 4.62 MIL/uL (ref 3.87–5.11)
RDW: 17.3 % — ABNORMAL HIGH (ref 11.5–15.5)
WBC: 6.6 10*3/uL (ref 4.0–10.5)

## 2017-07-07 LAB — URINALYSIS, ROUTINE W REFLEX MICROSCOPIC
BILIRUBIN URINE: NEGATIVE
Glucose, UA: >=500 mg/dL — AB
Ketones, ur: 80 mg/dL — AB
LEUKOCYTES UA: NEGATIVE
Nitrite: NEGATIVE
PROTEIN: NEGATIVE mg/dL
Specific Gravity, Urine: 1.014 (ref 1.005–1.030)
pH: 5 (ref 5.0–8.0)

## 2017-07-07 LAB — CBG MONITORING, ED
GLUCOSE-CAPILLARY: 273 mg/dL — AB (ref 65–99)
GLUCOSE-CAPILLARY: 232 mg/dL — AB (ref 65–99)

## 2017-07-07 MED ORDER — METFORMIN HCL 1000 MG PO TABS: 500.0000 mg | ORAL_TABLET | Freq: Two times a day (BID) | ORAL | 0 refills | Status: DC

## 2017-07-07 MED ORDER — HYDROCORTISONE 1 % EX CREA: TOPICAL_CREAM | CUTANEOUS | 0 refills | Status: DC

## 2017-07-07 MED ORDER — SODIUM CHLORIDE 0.9 % IV BOLUS (SEPSIS)
1000.0000 mL | Freq: Once | INTRAVENOUS | Status: AC
  Administered 2017-07-07: 1000 mL via INTRAVENOUS

## 2017-07-07 MED ORDER — OXYCODONE-ACETAMINOPHEN 5-325 MG PO TABS
1.0000 | ORAL_TABLET | Freq: Once | ORAL | Status: AC
  Administered 2017-07-07: 1 via ORAL
  Filled 2017-07-07: qty 1

## 2017-07-07 MED ORDER — GABAPENTIN 300 MG PO CAPS: 300.0000 mg | ORAL_CAPSULE | Freq: Two times a day (BID) | ORAL | 0 refills | Status: DC

## 2017-07-07 MED ORDER — DIPHENHYDRAMINE HCL 25 MG PO CAPS
25.0000 mg | ORAL_CAPSULE | Freq: Once | ORAL | Status: AC
  Administered 2017-07-07: 25 mg via ORAL
  Filled 2017-07-07: qty 1

## 2017-07-07 NOTE — ED Triage Notes (Signed)
Patient here with fine itchy rash to arms for the past few days, also has pain when she grips her right hand. On assessment fine rash to legs as well. No distress, no known exposure

## 2017-07-07 NOTE — Discharge Instructions (Signed)
Please read attached information regarding your condition. Apply hydrocortisone cream to affected area as directed. Begin taking your metformin for your diabetes.  Take gabapentin for your arm pain and neuropathy. [facility] for further evaluation and primary care evaluation. Return to ED for worsening symptoms, worsening rash, trouble breathing, trouble swallowing, chest pain, vomiting with lightheadedness.   Por favor lea la informacin adjunta sobre su condicin. Aplique crema de hidrocortisona en el rea afectada segn las indicaciones. Comience a tomar su metformina para su diabetes. Tome gabapentina para el dolor de su brazo y neuropata. Seguimiento en el centro de bienestar para una evaluacin adicional y Neomia Dearuna evaluacin de atencin primaria. Regrese a la ED para FedExempeorar los sntomas, empeoramiento de la erupcin, dificultad para respirar, dificultad para tragar, dolor en el pecho, vmitos con mareos.

## 2017-07-07 NOTE — ED Provider Notes (Signed)
[facility] Provider Note   CSN: [id number] Arrival date & time: 07/07/17  [id number]     History   Chief Complaint Chief Complaint  Patient presents with  . rash/arm pain    HPI [name] is a 42 y.o. female with no significant past medical history, who presents to ED for evaluation of multiple complaints.  Her first complaint is a fine, erythematous, itchy rash to arms and legs for the past 2 days.  She states that it started off as a fine red rash and then progressively got more itchy.  She cannot recall any new soaps, lotions or detergents at that caused the rash.  She has not use any medications to help with symptoms.  No sick contacts at home with similar symptoms.  She denies any lip swelling, anaphylaxis, prior history of similar symptoms in the past, fevers, outdoor exposures or new medication exposures. Her next complaint is bilateral arm pain for the past 3 days.  She states that both of her arms feel "achy and sore" especially with movement.  She does heavy lifting at home and is unsure if this is what caused the pain.  Also reports intermittent bilateral finger tinging. She has been taking Advil with mild improvement in her symptoms.  Denies any previous fracture, dislocations or procedures in the area.  She denies any fever.  HPI  History reviewed. No pertinent past medical history.  There are no active problems to display for this patient.   Past Surgical History:  Procedure Laterality Date  . CESAREAN SECTION      OB History    Gravida Para Term Preterm AB Living   4 3 3   1 3    SAB TAB Ectopic Multiple Live Births   1               Home Medications    Prior to Admission medications   Medication Sig Start Date End Date Taking? Authorizing Provider  cephALEXin (KEFLEX) 500 MG capsule Take 1 capsule (500 mg total) by mouth 4 (four) times daily. 10/21/12   Archie PattenFrazier, Natalie K, CNM  gabapentin (NEURONTIN) 300 MG  capsule Take 1 capsule (300 mg total) by mouth 2 (two) times daily. 07/07/17   Korben Carcione, PA-C  hydrocortisone cream 1 % Apply to affected area 2 times daily 07/07/17   Royce Sciara, PA-C  ibuprofen (ADVIL,MOTRIN) 600 MG tablet Take 1 tablet (600 mg total) by mouth every 6 (six) hours as needed for pain. 10/21/12   Willodean RosenthalHarraway-Smith, Carolyn, MD  metFORMIN (GLUCOPHAGE) 1000 MG tablet Take 0.5 tablets (500 mg total) by mouth 2 (two) times daily. 07/07/17   Dietrich PatesKhatri, Charice Zuno, PA-C    Family History Family History  Problem Relation Age of Onset  . Hypertension Mother   . Diabetes Mother     Social History Social History   Tobacco Use  . Smoking status: Never Smoker  . Smokeless tobacco: Never Used  Substance Use Topics  . Alcohol use: No  . Drug use: No     Allergies   Patient has no known allergies.   Review of Systems Review of Systems  Constitutional: Negative for appetite change, chills, fatigue and fever.  HENT: Negative for facial swelling.   Gastrointestinal: Negative for nausea and vomiting.  Musculoskeletal: Positive for myalgias. Negative for arthralgias, back pain, gait problem, neck pain and neck stiffness.  Skin: Positive for rash. Negative for pallor.  Neurological: Negative for weakness and numbness.  Physical Exam Updated Vital Signs BP 108/74   Pulse (!) 104   Temp 99.2 F (37.3 C) (Oral)   Resp 18   SpO2 99%   Physical Exam  Constitutional: She appears well-developed and well-nourished. No distress.  Nontoxic appearing and in no acute distress.  Speaking complete sentences without signs of respiratory distress or airway compromise.  HENT:  Head: Normocephalic and atraumatic.  Nose: Nose normal.  No angioedema noted.  Eyes: Conjunctivae and EOM are normal. Left eye exhibits no discharge. No scleral icterus.  Neck: Normal range of motion. Neck supple.  Cardiovascular: Normal rate, regular rhythm, normal heart sounds and intact distal pulses. Exam  reveals no gallop and no friction rub.  No murmur heard. Pulmonary/Chest: Effort normal and breath sounds normal. No respiratory distress.  Abdominal: Soft. Bowel sounds are normal. She exhibits no distension. There is no tenderness. There is no guarding.  Musculoskeletal: Normal range of motion. She exhibits no edema, tenderness or deformity.  Tenderness to palpation of the entire right and left upper extremities.  Able to perform full active and passive range of motion of shoulders, elbows and wrist.  Equal grip strength bilaterally in upper extremities.  Sensation intact to light touch of bilateral upper extremities.  2+ radial pulse noted bilaterally.  Neurological: She is alert. She exhibits normal muscle tone. Coordination normal.  Skin: Skin is warm and dry. Rash noted. She is not diaphoretic. There is erythema.  Fine, erythematous papular rash in bilateral upper and lower extremities excluding the palms and soles.  Psychiatric: She has a normal mood and affect.  Nursing note and vitals reviewed.    ED Treatments / Results  Labs (all labs ordered are listed, but only abnormal results are displayed) Labs Reviewed  BASIC METABOLIC PANEL - Abnormal; Notable for the following components:      Result Value   Sodium 131 (*)    Chloride 100 (*)    CO2 19 (*)    Glucose, Bld 291 (*)    Calcium 8.7 (*)    All other components within normal limits  CBC WITH DIFFERENTIAL/PLATELET - Abnormal; Notable for the following components:   Hemoglobin 11.2 (*)    HCT 34.5 (*)    MCV 74.7 (*)    MCH 24.2 (*)    RDW 17.3 (*)    Lymphs Abs 0.3 (*)    All other components within normal limits  URINALYSIS, ROUTINE W REFLEX MICROSCOPIC - Abnormal; Notable for the following components:   Glucose, UA >=500 (*)    Hgb urine dipstick SMALL (*)    Ketones, ur 80 (*)    Bacteria, UA RARE (*)    Squamous Epithelial / LPF 0-5 (*)    All other components within normal limits  CBG MONITORING, ED -  Abnormal; Notable for the following components:   Glucose-Capillary 273 (*)    All other components within normal limits  CBG MONITORING, ED - Abnormal; Notable for the following components:   Glucose-Capillary 232 (*)    All other components within normal limits    EKG  EKG Interpretation None       Radiology No results found.  Procedures Procedures (including critical care time)  Medications Ordered in ED Medications  diphenhydrAMINE (BENADRYL) capsule 25 mg (25 mg Oral Given 07/07/17 0942)  sodium chloride 0.9 % bolus 1,000 mL (0 mLs Intravenous Stopped 07/07/17 1104)  oxyCODONE-acetaminophen (PERCOCET/ROXICET) 5-325 MG per tablet 1 tablet (1 tablet Oral Given 07/07/17 1140)     Initial  Impression / Assessment and Plan / ED Course  I have reviewed the triage vital signs and the nursing notes.  Pertinent labs & imaging results that were available during my care of the patient were reviewed by me and considered in my medical decision making (see chart for details).     Patient presents to ED for evaluation of multiple complaints.  Her first complaint is a fine, erythematous itchy rash on arms and legs for the past 2 days.  Cannot recall any new environmental after exposures.  Denies any lip swelling, anaphylaxis, fevers.  Her next complaint is bilateral arm pain for the past 3 days.  States that both of her arms feel sore and intermittent tingling sensation in bilateral fingers.  She does not followed by PCP.  When asked about history of diabetes, she states that she did have gestational diabetes when pregnant with her youngest son who is now 72 years old.  She has not checked her blood sugar since then.  Initial CBG today was 273 fasting.  She is afebrile here and mildly tachycardic at low 100s.  She denies any polyuria, polydipsia, vomiting.  Remainder of lab work showed slight hyponatremia at 131, glucose of 291, otherwise unremarkable.  Urinalysis showed glucosuria, 80 ketones.   Anion gap was normal.  Patient given fluids here with some improvement in her blood sugars to low 200s.  I did stressed to her the importance of controlling and monitoring her blood sugars and that she needs to be followed by primary care provider.  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  Cannot give oral glucocorticoids due to presence of hypoglycemia for rash but will give mild topical corticosteroids to help as well. Pt has a patent airway without stridor and is handling secretions without difficulty; no angioedema. No blisters, no pustules, no warmth, no draining sinus tracts, no superficial abscesses, no bullous impetigo, no vesicles, no desquamation, no target lesions with dusky purpura or a central bulla. Not tender to touch. No concern for superimposed infection. No concern for SJS, TEN, TSS, tick borne illness, syphilis or other life-threatening condition.  She reports improvement in her pain with pain medication given here.  Did also advise her to continue taking Benadryl to help with itching.  Patient appears stable for discharge at this time.  Strict return precautions given.  Portions of this note were generated with Scientist, clinical (histocompatibility and immunogenetics). Dictation errors may occur despite best attempts at proofreading.   Final Clinical Impressions(s) / ED Diagnoses   Final diagnoses:  Hyperglycemia  Neuropathy  Rash and nonspecific skin eruption    ED Discharge Orders        Ordered    metFORMIN (GLUCOPHAGE) 1000 MG tablet  2 times daily     07/07/17 1251    gabapentin (NEURONTIN) 300 MG capsule  2 times daily     07/07/17 1251    hydrocortisone cream 1 %     07/07/17 1251       Dietrich Pates, PA-C 07/07/17 1551    Azalia Bilis, MD 07/07/17 1704

## 2017-07-07 NOTE — ED Notes (Signed)
ED Provider at bedside. 

## 2017-07-07 NOTE — ED Notes (Signed)
Pt reports increasing pain in arms, EDP made aware.

## 2017-07-07 NOTE — ED Notes (Signed)
Pt unable to void at this time. 

## 2017-07-09 ENCOUNTER — Ambulatory Visit (INDEPENDENT_AMBULATORY_CARE_PROVIDER_SITE_OTHER): Payer: Self-pay | Admitting: Physician Assistant

## 2017-07-09 ENCOUNTER — Encounter (INDEPENDENT_AMBULATORY_CARE_PROVIDER_SITE_OTHER): Payer: Self-pay | Admitting: Physician Assistant

## 2017-07-09 VITALS — BP 98/66 | HR 88 | Temp 97.8°F | Resp 18 | Ht 62.0 in | Wt 163.0 lb

## 2017-07-09 DIAGNOSIS — R739 Hyperglycemia, unspecified: Secondary | ICD-10-CM | POA: Insufficient documentation

## 2017-07-09 DIAGNOSIS — R21 Rash and other nonspecific skin eruption: Secondary | ICD-10-CM

## 2017-07-09 DIAGNOSIS — D649 Anemia, unspecified: Secondary | ICD-10-CM

## 2017-07-09 DIAGNOSIS — E1142 Type 2 diabetes mellitus with diabetic polyneuropathy: Secondary | ICD-10-CM

## 2017-07-09 DIAGNOSIS — R0602 Shortness of breath: Secondary | ICD-10-CM

## 2017-07-09 DIAGNOSIS — Z79899 Other long term (current) drug therapy: Secondary | ICD-10-CM

## 2017-07-09 DIAGNOSIS — L299 Pruritus, unspecified: Secondary | ICD-10-CM

## 2017-07-09 LAB — POCT GLYCOSYLATED HEMOGLOBIN (HGB A1C): Hemoglobin A1C: 10.6

## 2017-07-09 LAB — GLUCOSE, POCT (MANUAL RESULT ENTRY): POC Glucose: 198 mg/dl — AB (ref 70–99)

## 2017-07-09 MED ORDER — BLOOD GLUCOSE MONITOR KIT: PACK | 0 refills | Status: DC

## 2017-07-09 MED ORDER — METFORMIN HCL 1000 MG PO TABS
1000.0000 mg | ORAL_TABLET | Freq: Two times a day (BID) | ORAL | 5 refills | Status: DC
Start: 2017-07-09 — End: 2017-07-29

## 2017-07-09 MED ORDER — SENNOSIDES-DOCUSATE SODIUM 8.6-50 MG PO TABS: 1.0000 | ORAL_TABLET | Freq: Every day | ORAL | 0 refills | Status: DC

## 2017-07-09 MED ORDER — GLIMEPIRIDE 2 MG PO TABS: 2.0000 mg | ORAL_TABLET | Freq: Every day | ORAL | 5 refills | Status: DC

## 2017-07-09 MED ORDER — DIPHENHYDRAMINE HCL 50 MG PO CAPS
50.0000 mg | ORAL_CAPSULE | Freq: Once | ORAL | Status: AC
  Administered 2017-07-09: 50 mg via ORAL

## 2017-07-09 MED ORDER — FERROUS SULFATE 325 (65 FE) MG PO TBEC
325.0000 mg | DELAYED_RELEASE_TABLET | Freq: Three times a day (TID) | ORAL | 0 refills | Status: DC
Start: 2017-07-09 — End: 2018-01-21

## 2017-07-09 MED ORDER — METHYLPREDNISOLONE SODIUM SUCC 125 MG IJ SOLR
125.0000 mg | Freq: Once | INTRAMUSCULAR | Status: AC
  Administered 2017-07-09: 125 mg via INTRAMUSCULAR

## 2017-07-09 MED FILL — !TRUE METRIX BLOOD GLUCOSE: 365 days supply | Qty: 1 | Fill #0

## 2017-07-09 MED FILL — TRUE METRIX TEST STRIP: 25 days supply | Qty: 100 | Fill #0

## 2017-07-09 MED FILL — TRUEplus LANCETS 28G MISC: 25 days supply | Qty: 100 | Fill #0

## 2017-07-09 NOTE — Progress Notes (Signed)
Subjective:  Patient ID: [name], female    DOB: 31-Jul-1975  Age: 42 y.o. MRN: [id number]  CC: hyperglycemia  HPI [name] is a 42 y.o. female with a medical history of BTL presents on hospital f/u for hyperglycemia. Went to ED on 07/07/17 with complaint of pruritic rash and "achy" arms associated to tingling fingers. Work up revealed fasting glucose of 273, urine glucose >=500 mg/dL, ketones 80 mg/dL, Cl 782, and Ca 8.7. A1c 10.7% and CBG 198 in clinic today. Endorses polydipsia, polyuria, fatigue, tingling of hands, visual blurring at long distance, mild headache, and mild SOB. Does not endorse abdominal pain, nausea, vomiting, diarrhea, confusion, CP, palpitations, dysuria, or other GI sxs.     Has an erythematous and pruritic rash on the neck, upper chest, thighs, and arms. Rash continues despite use of steroid cream prescribed at the ED. Says the cream has not helped. Rash and pruritus was relieved somewhat with antihistamine given to her by her friend.         Outpatient Medications Prior to Visit  Medication Sig Dispense Refill  . gabapentin (NEURONTIN) 300 MG capsule Take 1 capsule (300 mg total) by mouth 2 (two) times daily. 15 capsule 0  . hydrocortisone cream 1 % Apply to affected area 2 times daily 15 g 0  . ibuprofen (ADVIL,MOTRIN) 600 MG tablet Take 1 tablet (600 mg total) by mouth every 6 (six) hours as needed for pain. 30 tablet 1  . metFORMIN (GLUCOPHAGE) 1000 MG tablet Take 0.5 tablets (500 mg total) by mouth 2 (two) times daily. 30 tablet 0  . cephALEXin (KEFLEX) 500 MG capsule Take 1 capsule (500 mg total) by mouth 4 (four) times daily. 28 capsule 0   No facility-administered medications prior to visit.      ROS Review of Systems  Constitutional: Positive for malaise/fatigue. Negative for chills and fever.  Eyes: Negative for blurred vision.  Respiratory: Negative for shortness of breath.   Cardiovascular: Negative for chest pain and  palpitations.  Gastrointestinal: Negative for abdominal pain and nausea.  Genitourinary: Negative for dysuria and hematuria.  Musculoskeletal: Negative for joint pain and myalgias.  Skin: Positive for rash.  Neurological: Negative for tingling and headaches.  Endo/Heme/Allergies: Positive for polydipsia.  Psychiatric/Behavioral: Negative for depression. The patient is not nervous/anxious.     Objective:   Vitals:   07/09/17 0923 07/09/17 0925  BP: 98/66 98/66  Pulse: 88 88  Resp:    Temp:    SpO2:      Physical Exam  Constitutional: She is oriented to person, place, and time.  Well developed, well nourished, NAD, polite  HENT:  Head: Normocephalic and atraumatic.  Eyes: Conjunctivae are normal. Pupils are equal, round, and reactive to light. No scleral icterus.  Neck: Normal range of motion. Neck supple. No thyromegaly present.  Cardiovascular: Normal rate, regular rhythm and normal heart sounds.  Pulmonary/Chest: Effort normal and breath sounds normal.  Abdominal: Soft. Bowel sounds are normal. There is no tenderness.  Musculoskeletal: She exhibits no edema.  Lymphadenopathy:    She has no cervical adenopathy.  Neurological: She is alert and oriented to person, place, and time. No cranial nerve deficit. Coordination normal.  Skin:  Large patches of mild erythema with few tiny papules on the upper chest, neck, arms, and thighs.  Psychiatric: She has a normal mood and affect. Her behavior is normal. Thought content normal.  Vitals reviewed.    Assessment & Plan:    1. Type 2 diabetes  mellitus with diabetic polyneuropathy, without long-term current use of insulin (HCC) - Increase metFORMIN (GLUCOPHAGE) 1000 MG tablet; Take 1 tablet (1,000 mg total) by mouth 2 (two) times daily.  Dispense: 60 tablet; Refill: 5 - Begin glimepiride (AMARYL) 2 MG tablet; Take 1 tablet (2 mg total) by mouth daily before breakfast.  Dispense: 30 tablet; Refill: 5  2. Anemia, unspecified  type - ferrous sulfate 325 (65 FE) MG EC tablet; Take 1 tablet (325 mg total) by mouth 3 (three) times daily with meals.  Dispense: 90 tablet; Refill: 0 - CBC with Differential - Comprehensive metabolic panel - TSH  3. Hyperglycemia - HgB A1c 10.7% today - Glucose (CBG) 198 today  4. Severe itching - diphenhydrAMINE (BENADRYL) capsule 50 mg - CBC with Differential - Comprehensive metabolic panel - TSH  5. Rash and nonspecific skin eruption - CBC with Differential - Comprehensive metabolic panel - TSH - methylPREDNISolone sodium succinate (SOLU-MEDROL) 125 mg/2 mL injection 125 mg  6. SOB (shortness of breath) - methylPREDNISolone sodium succinate (SOLU-MEDROL) 125 mg/2 mL injection 125 mg  7. High risk medication use -Constipation with ferrous sulfate - Begin senna-docusate (SENOKOT-S) 8.6-50 MG tablet; Take 1 tablet by mouth daily.  Dispense: 30 tablet; Refill: 0   Meds ordered this encounter  Medications  . diphenhydrAMINE (BENADRYL) capsule 50 mg  . metFORMIN (GLUCOPHAGE) 1000 MG tablet    Sig: Take 1 tablet (1,000 mg total) by mouth 2 (two) times daily.    Dispense:  60 tablet    Refill:  5    Order Specific Question:   Supervising Provider    Answer:   Quentin AngstJEGEDE, OLUGBEMIGA E [id number]  . glimepiride (AMARYL) 2 MG tablet    Sig: Take 1 tablet (2 mg total) by mouth daily before breakfast.    Dispense:  30 tablet    Refill:  5    Order Specific Question:   Supervising Provider    Answer:   Quentin AngstJEGEDE, OLUGBEMIGA E [id number]]  . ferrous sulfate 325 (65 FE) MG EC tablet    Sig: Take 1 tablet (325 mg total) by mouth 3 (three) times daily with meals.    Dispense:  90 tablet    Refill:  0    Order Specific Question:   Supervising Provider    Answer:   Quentin AngstJEGEDE, OLUGBEMIGA E [id number]]  . senna-docusate (SENOKOT-S) 8.6-50 MG tablet    Sig: Take 1 tablet by mouth daily.    Dispense:  30 tablet    Refill:  0    Order Specific Question:   Supervising Provider    Answer:    Quentin AngstJEGEDE, OLUGBEMIGA E [id number]  . methylPREDNISolone sodium succinate (SOLU-MEDROL) 125 mg/2 mL injection 125 mg    Follow-up: Return in about 2 weeks (around 07/23/2017) for CBG and diabetes and rash.   Loletta Specteroger David Carnelia Oscar PA

## 2017-07-09 NOTE — Patient Instructions (Signed)
Diabetes mellitus y nutrición  Diabetes Mellitus and Nutrition  Si sufre de diabetes (diabetes mellitus), es muy importante tener hábitos alimenticios saludables debido a que sus niveles de azúcar en la sangre (glucosa) se ven afectados en gran medida por lo que come y bebe. Comer alimentos saludables en las cantidades adecuadas, aproximadamente a la misma hora todos los días, lo ayudará a:  · Controlar la glucemia.  · Disminuir el riesgo de sufrir una enfermedad cardíaca.  · Mejorar la presión arterial.  · Alcanzar o mantener un peso saludable.    Todas las personas que sufren de diabetes son diferentes y cada una tiene necesidades diferentes en cuanto a un plan de alimentación. El médico puede recomendarle que trabaje con un especialista en dietas y nutrición (nutricionista) para elaborar el mejor plan para usted. Su plan de alimentación puede variar según factores como:  · Las calorías que necesita.  · Los medicamentos que toma.  · Su peso.  · Sus niveles de glucemia, presión arterial y colesterol.  · Su nivel de actividad.  · Otras afecciones que tenga, como enfermedades cardíacas o renales.    ¿Cómo me afectan los carbohidratos?  Los carbohidratos afectan el nivel de glucemia más que cualquier otro tipo de alimento. La ingesta de carbohidratos naturalmente aumenta la cantidad glucosa en la sangre. El recuento de carbohidratos es un método destinado a llevar un registro de la cantidad de carbohidratos que se ingieren. El recuento de carbohidratos es importante para mantener la glucemia a un nivel saludable, en especial si utiliza insulina o toma determinados medicamentos por vía oral para la diabetes.  Es importante saber la cantidad de carbohidratos que se pueden ingerir en cada comida sin correr ningún riesgo. Esto es diferente en cada persona. El nutricionista puede ayudarlo a calcular la cantidad de carbohidratos que debe ingerir en cada comida y colación.   Los alimentos que contienen carbohidratos incluyen:  · Pan, cereal, arroz, pasta y galletas.  · Papas y maíz.  · Guisantes, frijoles y lentejas.  · Leche y yogur.  · Frutas y jugo.  · Postres, como pasteles, galletitas, helado y caramelos.    ¿Cómo me afecta el alcohol?  El alcohol puede provocar disminuciones súbitas de la glucemia (hipoglucemia), en especial si utiliza insulina o toma determinados medicamentos por vía oral para la diabetes. La hipoglucemia es una afección potencialmente mortal. Los síntomas de la hipoglucemia (somnolencia, mareos y confusión) son similares a los síntomas de haber consumido demasiado alcohol.  Si el médico afirma que el alcohol es seguro para usted, siga estas pautas:  · Limite el consumo de alcohol a no más de 1 medida por día si es mujer y no está embarazada, y a 2 medidas si es hombre. Una medida equivale a 12 oz (355 ml) de cerveza, 5 oz (148 ml) de vino o 1½ oz (44 ml) de bebidas de alta graduación alcohólica.  · No beba con el estómago vacío.  · Manténgase hidratado con agua, gaseosas dietéticas o té helado sin azúcar.  · Tenga en cuenta que las gaseosas comunes, los jugos y otros refrescos pueden contener mucha azúcar y se deben contar como carbohidratos.    Consejos para seguir este plan  Leer las etiquetas de los alimentos  · Comience por controlar el tamaño de la porción en la etiqueta. La cantidad de calorías, carbohidratos, grasas y otros nutrientes mencionados en la etiqueta se basan en una porción del alimento. Muchos alimentos contienen más de una porción por envase.  · Verifique la cantidad total de gramos (g)   de carbohidratos totales en una porción. Puede calcular la cantidad de porciones de carbohidratos al dividir el total de carbohidratos por 15. Por ejemplo, si un alimento posee un total de 30 g de carbohidratos, equivale a 2 porciones de carbohidratos.  · Verifique la cantidad de gramos (g) de grasas saturadas y grasas trans  en una porción. Escoja alimentos que no contengan grasa o que tengan un bajo contenido.  · Controle la cantidad de miligramos (mg) de sodio en una porción. La mayoría de las personas deben limitar la ingesta de sodio total a menos de 2300 mg por día.  · Siempre consulte la información nutricional de los alimentos etiquetados como “con bajo contenido de grasa” o “sin grasa”. Estos alimentos pueden ser más altos en azúcar agregada o en carbohidratos refinados y deben evitarse.  · Hable con el nutricionista para identificar sus objetivos diarios en cuanto a los nutrientes mencionados en la etiqueta.  De compras  · Evite comprar alimentos procesados, enlatados o prehechos. Estos alimentos tienden a tener mayor cantidad de grasa, sodio y azúcar agregada.  · Compre en la zona exterior de la tienda de comestibles. Esta incluye frutas y vegetales frescos, granos a granel, carnes frescas y productos lácteos frescos.  Cocción  · Utilice métodos de cocción a baja temperatura, como hornear, en lugar de métodos de cocción a alta temperatura, como freír en abundante aceite.  · Cocine con aceites saludables, como el aceite de oliva, canola o girasol.  · Evite cocinar con manteca, crema o carnes con alto contenido de grasa.  Planificación de las comidas  · Consuma las comidas y las colaciones de forma regular, preferentemente a la misma hora todos los días. Evite pasar largos períodos de tiempo sin comer.  · Consuma alimentos ricos en fibra, como frutas frescas, verduras, frijoles y cereales integrales. Consulte al nutricionista sobre cuántas porciones de carbohidratos puede consumir en cada comida.  · Consuma entre 4 y 6 onzas de proteínas magras por día, como carnes magras, pollo, pescado, huevos o tofu. 1 onza equivale a 1 onza de carne, pollo o pescado, 1 huevo, o 1/4 taza de tofu.  · Coma algunos alimentos por día que contengan grasas saludables, como aguacates, frutos secos, semillas y pescado.  Estilo de vida     · Controle su nivel de glucemia con regularidad.  · Haga ejercicio al menos 30 minutos, 5 días o más por semana, o como se lo haya indicado el médico.  · Tome los medicamentos como se lo haya indicado el médico.  · No consuma ningún producto que contenga nicotina o tabaco, como cigarrillos y cigarrillos electrónicos. Si necesita ayuda para dejar de fumar, consulte al médico.  · Trabaje con un asesor o instructor en diabetes para identificar estrategias para controlar el estrés y cualquier desafío emocional y social.  ¿Cuáles son algunas de las preguntas que puedo hacerle a mi médico?  · ¿Es necesario que me reúna con un instructor en diabetes?  · ¿Es necesario que me reúna con un nutricionista?  · ¿A qué número puedo llamar si tengo preguntas?  · ¿Cuáles son los mejores momentos para controlar la glucemia?  Dónde encontrar más información:  · Asociación Americana de la Diabetes (American Diabetes Association): diabetes.org/food-and-fitness/food  · Academia de Nutrición y Dietética (Academy of Nutrition and Dietetics): www.eatright.org/resources/health/diseases-and-conditions/diabetes  · Instituto Nacional de la Diabetes y las Enfermedades Digestivas y Renales (National Institute of Diabetes and Digestive and Kidney Diseases) (Institutos Nacionales de Salud, NIH): www.niddk.nih.gov/health-information/diabetes/overview/diet-eating-physical-activity  Resumen  · Un plan de alimentación saludable   lo ayudará a controlar la glucemia y mantener un estilo de vida saludable.  · Trabajar con un especialista en dietas y nutrición (nutricionista) puede ayudarlo a elaborar el mejor plan de alimentación para usted.  · Tenga en cuenta que los carbohidratos y el alcohol tienen efectos inmediatos en sus niveles de glucemia. Es importante contar los carbohidratos y consumir alcohol con prudencia.  Esta información no tiene como fin reemplazar el consejo del médico. Asegúrese de hacerle al médico cualquier pregunta que tenga.   Document Released: 09/01/2007 Document Revised: 09/14/2016 Document Reviewed: 09/14/2016  Elsevier Interactive Patient Education © 2018 Elsevier Inc.

## 2017-07-10 LAB — CBC WITH DIFFERENTIAL/PLATELET
BASOS: 0 %
Basophils Absolute: 0 10*3/uL (ref 0.0–0.2)
EOS (ABSOLUTE): 0.2 10*3/uL (ref 0.0–0.4)
Eos: 4 %
Hematocrit: 31.4 % — ABNORMAL LOW (ref 34.0–46.6)
Hemoglobin: 10.1 g/dL — ABNORMAL LOW (ref 11.1–15.9)
IMMATURE GRANS (ABS): 0 10*3/uL (ref 0.0–0.1)
Immature Granulocytes: 0 %
LYMPHS: 10 %
Lymphocytes Absolute: 0.5 10*3/uL — ABNORMAL LOW (ref 0.7–3.1)
MCH: 23.7 pg — AB (ref 26.6–33.0)
MCHC: 32.2 g/dL (ref 31.5–35.7)
MCV: 74 fL — AB (ref 79–97)
MONOS ABS: 0.2 10*3/uL (ref 0.1–0.9)
Monocytes: 3 %
Neutrophils Absolute: 4.2 10*3/uL (ref 1.4–7.0)
Neutrophils: 83 %
PLATELETS: 190 10*3/uL (ref 150–379)
RBC: 4.26 x10E6/uL (ref 3.77–5.28)
RDW: 18.5 % — ABNORMAL HIGH (ref 12.3–15.4)
WBC: 5.1 10*3/uL (ref 3.4–10.8)

## 2017-07-10 LAB — COMPREHENSIVE METABOLIC PANEL
A/G RATIO: 1.2 (ref 1.2–2.2)
ALK PHOS: 79 IU/L (ref 39–117)
ALT: 13 IU/L (ref 0–32)
AST: 16 IU/L (ref 0–40)
Albumin: 3.3 g/dL — ABNORMAL LOW (ref 3.5–5.5)
BUN / CREAT RATIO: 19 (ref 9–23)
BUN: 13 mg/dL (ref 6–24)
Bilirubin Total: 0.5 mg/dL (ref 0.0–1.2)
CHLORIDE: 94 mmol/L — AB (ref 96–106)
CO2: 20 mmol/L (ref 20–29)
Calcium: 8.4 mg/dL — ABNORMAL LOW (ref 8.7–10.2)
Creatinine, Ser: 0.68 mg/dL (ref 0.57–1.00)
GFR calc non Af Amer: 108 mL/min/{1.73_m2} (ref >59)
GFR, EST AFRICAN AMERICAN: 125 mL/min/{1.73_m2} (ref >59)
Globulin, Total: 2.8 g/dL (ref 1.5–4.5)
Glucose: 193 mg/dL — ABNORMAL HIGH (ref 65–99)
POTASSIUM: 3.5 mmol/L (ref 3.5–5.2)
Sodium: 129 mmol/L — ABNORMAL LOW (ref 134–144)
TOTAL PROTEIN: 6.1 g/dL (ref 6.0–8.5)

## 2017-07-10 LAB — TSH: TSH: 1.23 u[IU]/mL (ref 0.450–4.500)

## 2017-07-15 ENCOUNTER — Ambulatory Visit (INDEPENDENT_AMBULATORY_CARE_PROVIDER_SITE_OTHER): Payer: Self-pay | Admitting: Physician Assistant

## 2017-07-15 ENCOUNTER — Encounter (INDEPENDENT_AMBULATORY_CARE_PROVIDER_SITE_OTHER): Payer: Self-pay | Admitting: Physician Assistant

## 2017-07-15 VITALS — BP 104/68 | HR 90 | Temp 97.8°F | Resp 18 | Ht 60.0 in | Wt 155.0 lb

## 2017-07-15 DIAGNOSIS — L299 Pruritus, unspecified: Secondary | ICD-10-CM

## 2017-07-15 DIAGNOSIS — M25521 Pain in right elbow: Secondary | ICD-10-CM

## 2017-07-15 DIAGNOSIS — R202 Paresthesia of skin: Secondary | ICD-10-CM

## 2017-07-15 DIAGNOSIS — M25522 Pain in left elbow: Secondary | ICD-10-CM

## 2017-07-15 DIAGNOSIS — R739 Hyperglycemia, unspecified: Secondary | ICD-10-CM

## 2017-07-15 LAB — GLUCOSE, POCT (MANUAL RESULT ENTRY): POC GLUCOSE: 187 mg/dL — AB (ref 70–99)

## 2017-07-15 MED ORDER — HYDROXYZINE HCL 25 MG PO TABS: 25.0000 mg | ORAL_TABLET | Freq: Every day | ORAL | 0 refills | Status: DC

## 2017-07-15 NOTE — Patient Instructions (Signed)
Parestesia (Paresthesia) La parestesia es una sensacin de ardor o picazn fuera de lo normal que, en general, aparece en las manos, los brazos, las piernas o los pies. Sin embargo, puede manifestarse en cualquier parte del cuerpo. Por lo general, no es dolorosa. La sensacin puede describirse con los siguientes trminos:  Hormigueo o adormecimiento.  Pinchazos.  Algo que trepa por la piel.  Vibracin.  Extremidades que se duermen.  Picazn. La mayora de las personas sienten parestesia temporal en algn momento de la vida. La parestesia puede producirse al respirar muy rpido (hiperventilacin). Tambin puede aparecer sin causa aparente. Es comn que haya parestesia al hacer presin sobre un nervio, pero la sensacin desaparece rpidamente al quitar la presin. Sin embargo, para algunas personas la parestesia es un problema de larga duracin (crnico) causado por un trastorno preexistente. Si sigue teniendo parestesia, es posible que necesite evaluacin mdica adicional. INSTRUCCIONES PARA EL CUIDADO EN EL HOGAR Controle su afeccin para ver si hay cambios. Las siguientes medidas pueden servir para aliviar cualquier molestia que sienta:  Evite el consumo de alcohol.  Pruebe con masajes o acupuntura como ayuda para aliviar los sntomas.  Concurra a todas las visitas de control como se lo haya indicado el mdico. Esto es importante. SOLICITE ATENCIN MDICA SI:  Contina teniendo episodios de parestesia.  La sensacin de ardor o picazn empeora al caminar.  Tiene dolor, calambres o mareos.  Le aparece una erupcin cutnea. SOLICITE ATENCIN MDICA DE INMEDIATO SI:  Se siente dbil.  Tiene dificultad para caminar o moverse.  Tiene problemas con el habla, la comprensin o la visin.  Se siente confundido.  No puede controlar la vejiga o los intestinos.  Siente adormecimiento despus de una lesin.  Se desmaya. Esta informacin no tiene como fin reemplazar el consejo del  mdico. Asegrese de hacerle al mdico cualquier pregunta que tenga. Document Released: 09/10/2008 Document Revised: 10/09/2014 Document Reviewed: 05/21/2014 Elsevier Interactive Patient Education  2018 Elsevier Inc.  

## 2017-07-15 NOTE — Progress Notes (Signed)
Subjective:  Patient ID: [name], female    DOB: September 11, 1975  Age: 42 y.o. MRN: [id number]  CC: F/U  HPI  [name] is a 42 y.o. female with a medical history of BTL presents on f/u for DM2 and rash. Solumedrol was administered nearly one week ago. Patient reports rash is gone. However, there is still generalized pruritus "everywhere". TSH and liver enzymes were normal. Also complains of burning bilateral arm pain originating from the palms and radiates to the shoulder bilaterally. Has not taken Gabapentin yet due to lack of efficacy and onset of mental  "fogginess" after taking one pill.    Patient states glucometer readings have ranged from 86 to 190s but regularly around 130-150.     Outpatient Medications Prior to Visit  Medication Sig Dispense Refill  . blood glucose meter kit and supplies KIT Dispense based on patient and insurance preference. Use up to four times daily as directed. (FOR ICD-9 250.00, 250.01). 1 each 0  . ferrous sulfate 325 (65 FE) MG EC tablet Take 1 tablet (325 mg total) by mouth 3 (three) times daily with meals. 90 tablet 0  . gabapentin (NEURONTIN) 300 MG capsule Take 1 capsule (300 mg total) by mouth 2 (two) times daily. 15 capsule 0  . glimepiride (AMARYL) 2 MG tablet Take 1 tablet (2 mg total) by mouth daily before breakfast. 30 tablet 5  . hydrocortisone cream 1 % Apply to affected area 2 times daily 15 g 0  . ibuprofen (ADVIL,MOTRIN) 600 MG tablet Take 1 tablet (600 mg total) by mouth every 6 (six) hours as needed for pain. 30 tablet 1  . metFORMIN (GLUCOPHAGE) 1000 MG tablet Take 1 tablet (1,000 mg total) by mouth 2 (two) times daily. 60 tablet 5  . senna-docusate (SENOKOT-S) 8.6-50 MG tablet Take 1 tablet by mouth daily. 30 tablet 0   No facility-administered medications prior to visit.      ROS Review of Systems  Constitutional: Negative for chills, fever and malaise/fatigue.  Eyes: Negative for blurred vision.   Respiratory: Negative for shortness of breath.   Cardiovascular: Negative for chest pain and palpitations.  Gastrointestinal: Negative for abdominal pain and nausea.  Genitourinary: Negative for dysuria and hematuria.  Musculoskeletal: Positive for back pain, joint pain, myalgias and neck pain.  Skin: Negative for rash.  Neurological: Negative for tingling and headaches.  Psychiatric/Behavioral: Negative for depression. The patient is not nervous/anxious.     Objective:  BP 104/68 (BP Location: Left Arm, Patient Position: Sitting, Cuff Size: Normal)   Pulse 90   Temp 97.8 F (36.6 C) (Oral)   Resp 18   Ht 5' (1.524 m)   Wt 155 lb (70.3 kg)   LMP 06/22/2017   SpO2 99%   BMI 30.27 kg/m   BP/Weight 07/15/2017 07/09/2017 01/19/4817  Systolic BP 563 98 149  Diastolic BP 68 66 74  Wt. (Lbs) 155 163 -  BMI 30.27 29.81 -      Physical Exam  Constitutional: She is oriented to person, place, and time.  Well developed, well nourished, NAD, polite  HENT:  Head: Normocephalic and atraumatic.  Eyes: No scleral icterus.  Neck: Normal range of motion. Neck supple. No thyromegaly present.  Cardiovascular: Normal rate, regular rhythm and normal heart sounds.  Pulmonary/Chest: Effort normal and breath sounds normal.  Abdominal: Soft. Bowel sounds are normal. There is no tenderness.  Musculoskeletal: She exhibits no edema.  Neurological: She is alert and oriented to person, place, and time.  Skin: Skin is warm and dry. No rash noted. No erythema. No pallor.  Psychiatric: She has a normal mood and affect. Her behavior is normal. Thought content normal.  Vitals reviewed.    Assessment & Plan:    1. Paresthesia - ANA w/Reflex - Sedimentation Rate - C-reactive protein  2. Arthralgia of right upper arm - ANA w/Reflex - Sedimentation Rate - C-reactive protein  3. Arthralgia of left upper arm - ANA w/Reflex - Sedimentation Rate - C-reactive protein  4. Hyperglycemia - Glucose  (CBG) 187 in clinic today  5. Generalized Pruritus - Begin Hydroxyzine 25 mg qhs #7, zero refills.  Follow-up: Return in about 2 months (around 09/12/2017) for diabetes.   Clent Demark PA

## 2017-07-16 ENCOUNTER — Ambulatory Visit: Payer: Self-pay | Attending: Physician Assistant

## 2017-07-16 LAB — ANA W/REFLEX: ANA: NEGATIVE

## 2017-07-16 LAB — SEDIMENTATION RATE: Sed Rate: 30 mm/hr (ref 0–32)

## 2017-07-16 LAB — C-REACTIVE PROTEIN: CRP: 7.8 mg/L — ABNORMAL HIGH (ref 0.0–4.9)

## 2017-07-22 ENCOUNTER — Telehealth (INDEPENDENT_AMBULATORY_CARE_PROVIDER_SITE_OTHER): Payer: Self-pay | Admitting: *Deleted

## 2017-07-22 NOTE — Telephone Encounter (Signed)
Medical Assistant used [employer] to contact patient.  Interpre[facility] #: [phone] Voicemail states to give a call back [facility] [place] [facility] at [phone]. !!!Patient is aware no autoimmune being present. Patient has mild acute inflammation. Patient will feel better after treatment. Continue as directed!!!

## 2017-07-22 NOTE — Telephone Encounter (Signed)
-----   Message from Loletta Specteroger David Gomez, PA-C sent at 07/16/2017 12:14 PM EST ----- No autoimmune disease. Mild acute inflammation. Patient may be feeling better after her treatment. Continue as directed.

## 2017-07-29 ENCOUNTER — Ambulatory Visit (INDEPENDENT_AMBULATORY_CARE_PROVIDER_SITE_OTHER): Payer: Self-pay | Admitting: Physician Assistant

## 2017-07-29 ENCOUNTER — Encounter (INDEPENDENT_AMBULATORY_CARE_PROVIDER_SITE_OTHER): Payer: Self-pay | Admitting: Physician Assistant

## 2017-07-29 VITALS — BP 102/66 | HR 91 | Temp 97.9°F | Resp 18 | Ht 63.0 in | Wt 152.0 lb

## 2017-07-29 DIAGNOSIS — M79605 Pain in left leg: Secondary | ICD-10-CM

## 2017-07-29 DIAGNOSIS — M79601 Pain in right arm: Secondary | ICD-10-CM

## 2017-07-29 DIAGNOSIS — M79604 Pain in right leg: Secondary | ICD-10-CM

## 2017-07-29 DIAGNOSIS — R531 Weakness: Secondary | ICD-10-CM

## 2017-07-29 DIAGNOSIS — R202 Paresthesia of skin: Secondary | ICD-10-CM

## 2017-07-29 DIAGNOSIS — M79602 Pain in left arm: Secondary | ICD-10-CM

## 2017-07-29 MED ORDER — NAPROXEN 500 MG PO TABS: 500.0000 mg | ORAL_TABLET | Freq: Two times a day (BID) | ORAL | 0 refills | Status: DC

## 2017-07-29 MED ORDER — ACETAMINOPHEN-CODEINE #3 300-30 MG PO TABS: 1.0000 | ORAL_TABLET | Freq: Three times a day (TID) | ORAL | 0 refills | Status: DC | PRN

## 2017-07-29 MED FILL — NAPROXEN 500 MG TABLET: 500 | 15 days supply | Qty: 30 | Fill #0

## 2017-07-29 MED FILL — ACETAMINOPHEN/COD #3 TABLET: 300-30 | 20 days supply | Qty: 60 | Fill #0

## 2017-07-29 NOTE — Progress Notes (Signed)
Subjective:  Patient ID: [name], female    DOB: 12/29/1975  Age: 42 y.o. MRN: [id number]  CC: itching f/u  HPI  Julia Dominguez-Urietais a 42 y.o.femalewith amedical historyof BTL presents on f/u of itching and bilateral arm pain. pain originating from the palms and radiates to the shoulder bilaterally. Has not taken Gabapentin yet due to lack of efficacy and onset of mental  "fogginess" after taking one pill. ANA negative, CRP mildly elevated, ESR upper limit of normal.  Patient states she continues with UE pain and now feels pain in the bilateral LE. Pains are in the "muscles and bones". Feels that minimum exertion causes pain and weakness of the limbs. Does not endorse f/c/n/v, rash, insect bite, urinary symptoms, SOB, abdominal pain, swelling, CP, palpitations, HA, visual blurring, or bowel dysfunction. Does not think metformin may be contributing to myalgias as myalgias were already present before metformin was started.    Outpatient Medications Prior to Visit  Medication Sig Dispense Refill  . blood glucose meter kit and supplies KIT Dispense based on patient and insurance preference. Use up to four times daily as directed. (FOR ICD-9 250.00, 250.01). 1 each 0  . ferrous sulfate 325 (65 FE) MG EC tablet Take 1 tablet (325 mg total) by mouth 3 (three) times daily with meals. 90 tablet 0  . gabapentin (NEURONTIN) 300 MG capsule Take 1 capsule (300 mg total) by mouth 2 (two) times daily. 15 capsule 0  . glimepiride (AMARYL) 2 MG tablet Take 1 tablet (2 mg total) by mouth daily before breakfast. 30 tablet 5  . hydrocortisone cream 1 % Apply to affected area 2 times daily 15 g 0  . hydrOXYzine (ATARAX/VISTARIL) 25 MG tablet Take 1 tablet (25 mg total) by mouth at bedtime. 7 tablet 0  . ibuprofen (ADVIL,MOTRIN) 600 MG tablet Take 1 tablet (600 mg total) by mouth every 6 (six) hours as needed for pain. 30 tablet 1  . metFORMIN (GLUCOPHAGE) 1000 MG tablet Take 1 tablet  (1,000 mg total) by mouth 2 (two) times daily. 60 tablet 5  . senna-docusate (SENOKOT-S) 8.6-50 MG tablet Take 1 tablet by mouth daily. 30 tablet 0   No facility-administered medications prior to visit.      ROS Review of Systems  Constitutional: Negative for chills, fever and malaise/fatigue.  Eyes: Negative for blurred vision.  Respiratory: Negative for shortness of breath.   Cardiovascular: Negative for chest pain and palpitations.  Gastrointestinal: Negative for abdominal pain and nausea.  Genitourinary: Negative for dysuria and hematuria.  Musculoskeletal: Positive for joint pain and myalgias.  Skin: Negative for rash.  Neurological: Negative for tingling and headaches.  Psychiatric/Behavioral: Negative for depression. The patient is not nervous/anxious.     Objective:  BP 102/66 (BP Location: Left Arm, Patient Position: Sitting, Cuff Size: Normal)   Pulse 91   Temp 97.9 F (36.6 C) (Oral)   Resp 18   Ht '5\' 3"'  (1.6 m)   Wt 152 lb (68.9 kg)   SpO2 99%   BMI 26.93 kg/m   BP/Weight 07/29/2017 06/14/5100 10/14/5275  Systolic BP 824 235 98  Diastolic BP 66 68 66  Wt. (Lbs) 152 155 163  BMI 26.93 30.27 29.81      Physical Exam  Constitutional: She is oriented to person, place, and time.  Well developed, well nourished, NAD, polite  HENT:  Head: Normocephalic and atraumatic.  Eyes: Conjunctivae are normal. No scleral icterus.  Neck: Normal range of motion. Neck supple. No thyromegaly present.  Cardiovascular: Normal rate, regular rhythm and normal heart sounds.  Pulmonary/Chest: Effort normal and breath sounds normal. No respiratory distress. She has no wheezes.  Abdominal: Soft. Bowel sounds are normal. There is no tenderness.  Musculoskeletal: She exhibits no edema or deformity.  Neurological: She is alert and oriented to person, place, and time. No cranial nerve deficit. Coordination normal.  Strength 5/5 throughout, light touch sensation intact.   Skin: Skin is  warm and dry. No rash noted. No erythema. No pallor.  Psychiatric: She has a normal mood and affect. Her behavior is normal. Thought content normal.  Vitals reviewed.    Assessment & Plan:    1. Paresthesia and pain of both upper extremities - CK - Protein electrophoresis, serum - Protein Electro, Random Urine - CBC with Differential - Aldolase - Ambulatory referral to Neurology - acetaminophen-codeine (TYLENOL #3) 300-30 MG tablet; Take 1 tablet by mouth every 8 (eight) hours as needed for moderate pain.  Dispense: 60 tablet; Refill: 0 - naproxen (NAPROSYN) 500 MG tablet; Take 1 tablet (500 mg total) by mouth 2 (two) times daily with a meal.  Dispense: 30 tablet; Refill: 0  2. Pain in both lower extremities - CK - Protein electrophoresis, serum - Protein Electro, Random Urine - CBC with Differential - Aldolase - Ambulatory referral to Neurology - Begin acetaminophen-codeine (TYLENOL #3) 300-30 MG tablet; Take 1 tablet by mouth every 8 (eight) hours as needed for moderate pain.  Dispense: 60 tablet; Refill: 0 - Begin naproxen (NAPROSYN) 500 MG tablet; Take 1 tablet (500 mg total) by mouth 2 (two) times daily with a meal.  Dispense: 30 tablet; Refill: 0  3. Generalized weakness - CK - Protein electrophoresis, serum - Protein Electro, Random Urine - CBC with Differential - Aldolase - Ambulatory referral to Neurology  *Pt is still working on obtaining financial assistance with [facility]. Will need her to complete process in order to have her evaluated at neurology.    Meds ordered this encounter  Medications  . acetaminophen-codeine (TYLENOL #3) 300-30 MG tablet    Sig: Take 1 tablet by mouth every 8 (eight) hours as needed for moderate pain.    Dispense:  60 tablet    Refill:  0    Order Specific Question:   Supervising Provider    Answer:   Tresa Garter [id number]  . naproxen (NAPROSYN) 500 MG tablet    Sig: Take 1 tablet (500 mg total) by mouth 2 (two) times daily  with a meal.    Dispense:  30 tablet    Refill:  0    Order Specific Question:   Supervising Provider    Answer:   Tresa Garter [[id number]]    Follow-up: Return in about 4 weeks (around 08/26/2017) for Neuropathy.   Clent Demark PA

## 2017-07-30 LAB — PROTEIN ELECTRO, RANDOM URINE
ALPHA-1-GLOBULIN, U: 0 %
Albumin ELP, Urine: 100 %
Alpha-2-Globulin, U: 0 %
BETA GLOBULIN, U: 0 %
Gamma Globulin, U: 0 %
Protein, Ur: 4.2 mg/dL

## 2017-08-03 ENCOUNTER — Telehealth (INDEPENDENT_AMBULATORY_CARE_PROVIDER_SITE_OTHER): Payer: Self-pay | Admitting: Physician Assistant

## 2017-08-03 NOTE — Telephone Encounter (Signed)
Pt called to request a refill for For the strip and pin l   to the test, please follow [facility]

## 2017-08-04 ENCOUNTER — Other Ambulatory Visit (INDEPENDENT_AMBULATORY_CARE_PROVIDER_SITE_OTHER): Payer: Self-pay | Admitting: Physician Assistant

## 2017-08-04 LAB — PROTEIN ELECTROPHORESIS, SERUM
A/G RATIO SPE: 1.1 (ref 0.7–1.7)
ALPHA 1: 0.2 g/dL (ref 0.0–0.4)
ALPHA 2: 0.8 g/dL (ref 0.4–1.0)
Albumin ELP: 3.8 g/dL (ref 2.9–4.4)
BETA: 1.1 g/dL (ref 0.7–1.3)
GAMMA GLOBULIN: 1.3 g/dL (ref 0.4–1.8)
Globulin, Total: 3.5 g/dL (ref 2.2–3.9)
Total Protein: 7.3 g/dL (ref 6.0–8.5)

## 2017-08-04 LAB — CBC WITH DIFFERENTIAL/PLATELET
BASOS ABS: 0 10*3/uL (ref 0.0–0.2)
BASOS: 0 %
EOS (ABSOLUTE): 0.2 10*3/uL (ref 0.0–0.4)
Eos: 4 %
HEMATOCRIT: 35.6 % (ref 34.0–46.6)
Hemoglobin: 10.9 g/dL — ABNORMAL LOW (ref 11.1–15.9)
Immature Grans (Abs): 0 10*3/uL (ref 0.0–0.1)
Immature Granulocytes: 0 %
LYMPHS ABS: 1.1 10*3/uL (ref 0.7–3.1)
Lymphs: 25 %
MCH: 24.8 pg — AB (ref 26.6–33.0)
MCHC: 30.6 g/dL — ABNORMAL LOW (ref 31.5–35.7)
MCV: 81 fL (ref 79–97)
Monocytes Absolute: 0.3 10*3/uL (ref 0.1–0.9)
Monocytes: 7 %
NEUTROS ABS: 2.8 10*3/uL (ref 1.4–7.0)
Neutrophils: 64 %
Platelets: 262 10*3/uL (ref 150–379)
RBC: 4.39 x10E6/uL (ref 3.77–5.28)
RDW: 22.5 % — AB (ref 12.3–15.4)
WBC: 4.5 10*3/uL (ref 3.4–10.8)

## 2017-08-04 LAB — ALDOLASE: Aldolase: 3.7 U/L (ref 3.3–10.3)

## 2017-08-04 LAB — CK: Total CK: 40 U/L (ref 24–173)

## 2017-08-06 ENCOUNTER — Telehealth (INDEPENDENT_AMBULATORY_CARE_PROVIDER_SITE_OTHER): Payer: Self-pay | Admitting: *Deleted

## 2017-08-06 NOTE — Telephone Encounter (Signed)
Medical Assistant used [employer] to contact patient.  Interpreter Name: Leotis ShamesLauren Interpreter #: [phone] Patient was not available, [employer] left patient a voicemail. !!!Please inform patient of no blood cancer or muscle damage and to take OTC gentle iron to address slight anemia!!!

## 2017-08-06 NOTE — Telephone Encounter (Signed)
Please provide refill if appropriate.

## 2017-08-06 NOTE — Telephone Encounter (Signed)
-----   Message from Loletta Specteroger David Gomez, PA-C sent at 08/04/2017  5:02 PM EST ----- No muscle damage, no blood cancer. Only slight anemia but better than previous reading. Take OTC Gentle Iron pills.

## 2017-08-09 ENCOUNTER — Other Ambulatory Visit (INDEPENDENT_AMBULATORY_CARE_PROVIDER_SITE_OTHER): Payer: Self-pay | Admitting: Physician Assistant

## 2017-08-09 NOTE — Telephone Encounter (Signed)
FWD to PCP. Lynasia Meloche S Britian Jentz, CMA  

## 2017-08-09 NOTE — Telephone Encounter (Signed)
Please take the time to ask what is the machine name/strip name. Also what do you mean by pin I?

## 2017-08-10 NOTE — Telephone Encounter (Signed)
Pt need a refill for Lancet and strips since she has True Metrix glucose, [facility], please follow up

## 2017-08-10 NOTE — Telephone Encounter (Signed)
FWD to PCP. Tempestt S Roberts, CMA  

## 2017-08-12 ENCOUNTER — Other Ambulatory Visit (INDEPENDENT_AMBULATORY_CARE_PROVIDER_SITE_OTHER): Payer: Self-pay | Admitting: Physician Assistant

## 2017-08-12 MED ORDER — TRUEPLUS LANCETS 28G MISC: 0 refills | Status: DC

## 2017-08-12 MED ORDER — GLUCOSE BLOOD VI STRP: ORAL_STRIP | 12 refills | Status: DC

## 2017-08-12 MED FILL — TRUE METRIX TEST STRIP: 25 days supply | Qty: 100 | Fill #0

## 2017-08-12 MED FILL — TRUEplus LANCETS 28G MISC: 25 days supply | Qty: 100 | Fill #0

## 2017-08-12 NOTE — Telephone Encounter (Signed)
Strips and lancets sent to [facility] pharmacy. Please inform patient to pick up supplies.

## 2017-08-12 NOTE — Progress Notes (Signed)
True.

## 2017-08-13 ENCOUNTER — Telehealth (INDEPENDENT_AMBULATORY_CARE_PROVIDER_SITE_OTHER): Payer: Self-pay

## 2017-08-13 NOTE — Telephone Encounter (Signed)
-----   Message from Loletta Specteroger David Gomez, PA-C sent at 07/12/2017 11:11 AM EST ----- Anemia and diabetes. Thyroid normal. Take medications as directed at the last visit.

## 2017-08-13 NOTE — Telephone Encounter (Signed)
Patient notified using interpreter [phone] . Maryjean Mornempestt S Roberts, CMA

## 2017-08-13 NOTE — Telephone Encounter (Signed)
Using interpreter [phone]) patient aware of anemia and diabetes, normal thyroid. Take meds as directed. Maryjean Mornempestt S Aryahi Denzler, CMA

## 2017-08-17 ENCOUNTER — Encounter (INDEPENDENT_AMBULATORY_CARE_PROVIDER_SITE_OTHER): Payer: Self-pay | Admitting: Physician Assistant

## 2017-08-17 ENCOUNTER — Ambulatory Visit (INDEPENDENT_AMBULATORY_CARE_PROVIDER_SITE_OTHER): Payer: Self-pay | Admitting: Physician Assistant

## 2017-08-17 VITALS — BP 115/76 | HR 70 | Temp 97.7°F | Wt 148.8 lb

## 2017-08-17 DIAGNOSIS — M654 Radial styloid tenosynovitis [de Quervain]: Secondary | ICD-10-CM

## 2017-08-17 DIAGNOSIS — M79602 Pain in left arm: Secondary | ICD-10-CM

## 2017-08-17 DIAGNOSIS — M79605 Pain in left leg: Secondary | ICD-10-CM

## 2017-08-17 DIAGNOSIS — R202 Paresthesia of skin: Secondary | ICD-10-CM

## 2017-08-17 DIAGNOSIS — N912 Amenorrhea, unspecified: Secondary | ICD-10-CM

## 2017-08-17 DIAGNOSIS — M79604 Pain in right leg: Secondary | ICD-10-CM

## 2017-08-17 DIAGNOSIS — G5603 Carpal tunnel syndrome, bilateral upper limbs: Secondary | ICD-10-CM

## 2017-08-17 DIAGNOSIS — M79601 Pain in right arm: Secondary | ICD-10-CM

## 2017-08-17 LAB — POCT URINE PREGNANCY: Preg Test, Ur: NEGATIVE

## 2017-08-17 MED ORDER — TRUEPLUS LANCETS 28G MISC: 11 refills | Status: DC

## 2017-08-17 MED ORDER — NAPROXEN 500 MG PO TABS: 500.0000 mg | ORAL_TABLET | Freq: Two times a day (BID) | ORAL | 0 refills | Status: DC

## 2017-08-17 MED FILL — NAPROXEN 500 MG TABLET: 500 | 15 days supply | Qty: 30 | Fill #0

## 2017-08-17 NOTE — Progress Notes (Signed)
Subjective:  Patient ID: [name], female    DOB: 12/01/1975  Age: 42 y.o. MRN: [id number]  CC: wrist pain  HPI  Julia Dominguez-Urietais a 43 y.o.femalewith amedical historyof BTL presents on f/uparesthsia and pain of both upper extremities, pain in both lower extremities, and generalized weakness. Says she is much better after taking naproxen and tylenol #3. Lower extremities are no longer weak or with paresthesias. Upper extremities are much better but still has some pain along the base of the thumb bilaterally and feeling numbness/tingling with repetitive motions of the hands/wrists. Complains of amenorrhea since February. Has a history of BTL. Last A1c 10.6% since February.    Outpatient Medications Prior to Visit  Medication Sig Dispense Refill  . acetaminophen-codeine (TYLENOL #3) 300-30 MG tablet Take 1 tablet by mouth every 8 (eight) hours as needed for moderate pain. 60 tablet 0  . blood glucose meter kit and supplies KIT Dispense based on patient and insurance preference. Use up to four times daily as directed. (FOR ICD-9 250.00, 250.01). 1 each 0  . ferrous sulfate 325 (65 FE) MG EC tablet Take 1 tablet (325 mg total) by mouth 3 (three) times daily with meals. 90 tablet 0  . gabapentin (NEURONTIN) 300 MG capsule Take 1 capsule (300 mg total) by mouth 2 (two) times daily. 15 capsule 0  . glucose blood (TRUE METRIX BLOOD GLUCOSE TEST) test strip Use as instructed up to four time per day. 100 each 12  . hydrocortisone cream 1 % Apply to affected area 2 times daily 15 g 0  . hydrOXYzine (ATARAX/VISTARIL) 25 MG tablet Take 1 tablet (25 mg total) by mouth at bedtime. 7 tablet 0  . ibuprofen (ADVIL,MOTRIN) 600 MG tablet Take 1 tablet (600 mg total) by mouth every 6 (six) hours as needed for pain. 30 tablet 1  . naproxen (NAPROSYN) 500 MG tablet Take 1 tablet (500 mg total) by mouth 2 (two) times daily with a meal. 30 tablet 0  . senna-docusate (SENOKOT-S) 8.6-50 MG  tablet Take 1 tablet by mouth daily. 30 tablet 0  . TRUEPLUS LANCETS 28G MISC USE AS DIRECTED UP TO 4 TIMES DAILY. 100 each 0   No facility-administered medications prior to visit.      ROS Review of Systems  Constitutional: Negative for chills, fever and malaise/fatigue.  Eyes: Negative for blurred vision.  Respiratory: Negative for shortness of breath.   Cardiovascular: Negative for chest pain and palpitations.  Gastrointestinal: Negative for abdominal pain and nausea.  Genitourinary: Negative for dysuria and hematuria.       Amenorrhea  Musculoskeletal: Negative for joint pain and myalgias.  Skin: Negative for rash.  Neurological: Positive for tingling. Negative for headaches.  Psychiatric/Behavioral: Negative for depression. The patient is not nervous/anxious.     Objective:  BP 115/76 (BP Location: Left Arm, Patient Position: Sitting, Cuff Size: Normal)   Pulse 70   Temp 97.7 F (36.5 C) (Oral)   Wt 148 lb 12.8 oz (67.5 kg)   LMP 06/16/2017 (Approximate)   SpO2 96%   BMI 26.36 kg/m   BP/Weight 08/17/2017 8/41/3244 0/06/270  Systolic BP 536 644 034  Diastolic BP 76 66 68  Wt. (Lbs) 148.8 152 155  BMI 26.36 26.93 30.27      Physical Exam  Constitutional: She is oriented to person, place, and time.  Well developed, well nourished, NAD, polite  HENT:  Head: Normocephalic and atraumatic.  Eyes: No scleral icterus.  Neck: Normal range of motion. Neck supple.  No thyromegaly present.  Cardiovascular: Normal rate, regular rhythm and normal heart sounds.  Pulmonary/Chest: Effort normal and breath sounds normal.  Musculoskeletal: She exhibits no edema.  Positive Finkelstein test bilaterally  Neurological: She is alert and oriented to person, place, and time. No cranial nerve deficit. Coordination normal.  Positive Tinel's and Phalen's.   Skin: Skin is warm and dry. No rash noted. No erythema. No pallor.  Psychiatric: She has a normal mood and affect. Her behavior is  normal. Thought content normal.  Vitals reviewed.    Assessment & Plan:    1. Paresthesia and pain of both upper extremitie - Refill naproxen (NAPROSYN) 500 MG tablet; Take 1 tablet (500 mg total) by mouth 2 (two) times daily with a meal.  Dispense: 30 tablet; Refill: 0  2. Pain in both lower extremities - Refill naproxen (NAPROSYN) 500 MG tablet; Take 1 tablet (500 mg total) by mouth 2 (two) times daily with a meal.  Dispense: 30 tablet; Refill: 0  3. Amenorrhea - POCT urine pregnancy negative in clinic today. - Normal TSH, hx of BTL. Irregularity may be attributed to DM2.   4. Bilateral carpal tunnel syndrome - Wrist splint with thumb spica - Ambulatory referral to Neurology  5. De Quervain's tenosynovitis, bilateral - Wrist splint with thumb spica   Meds ordered this encounter  Medications  . TRUEPLUS LANCETS 28G MISC    Sig: USE AS DIRECTED UP TO 4 TIMES DAILY.    Dispense:  100 each    Refill:  11    ICD10 - E11.10    Order Specific Question:   Supervising Provider    Answer:   Tresa Garter [id number]  . naproxen (NAPROSYN) 500 MG tablet    Sig: Take 1 tablet (500 mg total) by mouth 2 (two) times daily with a meal.    Dispense:  30 tablet    Refill:  0    Order Specific Question:   Supervising Provider    Answer:   Tresa Garter [[id number]]    Follow-up: Return in about 2 months (around 10/17/2017).   Clent Demark PA

## 2017-08-17 NOTE — Patient Instructions (Signed)
Sndrome del tnel carpiano (Carpal Tunnel Syndrome) El sndrome del tnel carpiano es una afeccin que causa dolor en la mano y en el brazo. El tnel carpiano es un espacio estrecho ubicado en el lado palmar de la mueca. Los movimientos repetidos de la mueca o determinadas enfermedades pueden causar la hinchazn del tnel. Esta hinchazn puede comprimir el nervio principal de la mueca (nervio mediano). CUIDADOS EN EL HOGAR Si tiene una frula:  sela como se lo haya indicado el mdico. Qutesela solamente como se lo haya indicado el mdico.  Afloje la frula si los dedos: ? Se le adormecen y siente hormigueos. ? Se le ponen azulados y fros.  Mantenga la frula limpia y seca. Instrucciones generales  Tome los medicamentos de venta libre y los recetados solamente como se lo haya indicado el mdico.  Haga reposar la mueca de toda actividad que le cause dolor. Si es necesario, hable con su empleador sobre los cambios que pueden hacerse en su lugar de trabajo, por ejemplo, usar una almohadilla para apoyar la mueca mientras tipea.  Si se lo indican, aplique hielo sobre la zona dolorida: ? Ponga el hielo en una bolsa plstica. ? Coloque una toalla entre la piel y la bolsa de hielo. ? Coloque el hielo durante 20minutos, 2 a 3veces por da.  Concurra a todas las visitas de control como se lo haya indicado el mdico. Esto es importante.  Haga los ejercicios como se lo hayan indicado el mdico, el fisioterapeuta o el terapeuta ocupacional. SOLICITE AYUDA SI:  Aparecen nuevos sntomas.  Los medicamentos no le alivian el dolor.  Los sntomas empeoran. Esta informacin no tiene como fin reemplazar el consejo del mdico. Asegrese de hacerle al mdico cualquier pregunta que tenga. Document Released: 05/14/2011 Document Revised: 02/13/2015 Document Reviewed: 10/10/2014 Elsevier Interactive Patient Education  2018 [employer]vier Inc.  

## 2017-08-20 ENCOUNTER — Other Ambulatory Visit (INDEPENDENT_AMBULATORY_CARE_PROVIDER_SITE_OTHER): Payer: Self-pay | Admitting: Physician Assistant

## 2017-08-20 ENCOUNTER — Telehealth (INDEPENDENT_AMBULATORY_CARE_PROVIDER_SITE_OTHER): Payer: Self-pay | Admitting: Physician Assistant

## 2017-08-20 DIAGNOSIS — Z1239 Encounter for other screening for malignant neoplasm of breast: Secondary | ICD-10-CM

## 2017-08-20 NOTE — Telephone Encounter (Signed)
FWD to PCP. Tempestt S Roberts, CMA  

## 2017-08-20 NOTE — Telephone Encounter (Signed)
Patient requesting a Mammogram  Thank you

## 2017-08-20 NOTE — Telephone Encounter (Signed)
Ok, I have put in the order for mammogram. Please call patient and give her the number to the [facility] to call and make her appointment.

## 2017-08-23 NOTE — Telephone Encounter (Signed)
I have left message with the phone number and address to the [facility] so pt may schedule her own appointment. No need to call again.

## 2017-08-23 NOTE — Telephone Encounter (Signed)
Please call patient and give her the number to the breast center so that she may schedule her own appointment. Maryjean Mornempestt S Murat Rideout, CMA

## 2017-09-10 ENCOUNTER — Ambulatory Visit (INDEPENDENT_AMBULATORY_CARE_PROVIDER_SITE_OTHER): Payer: Self-pay | Admitting: Diagnostic Neuroimaging

## 2017-09-10 ENCOUNTER — Encounter: Payer: Self-pay | Admitting: Diagnostic Neuroimaging

## 2017-09-10 VITALS — BP 100/64 | HR 72 | Ht 63.0 in | Wt 147.8 lb

## 2017-09-10 DIAGNOSIS — R200 Anesthesia of skin: Secondary | ICD-10-CM

## 2017-09-10 NOTE — Patient Instructions (Signed)
-   return for EMG (electrical nerve testing)  - use carpal tunnel wrist splints at bedtime

## 2017-09-10 NOTE — Progress Notes (Signed)
[facility]  PATIENT: [name] DOB: 17-Sep-1975  REFERRING CLINICIAN: Altamease Oiler, R HISTORY FROM: patient (via interpreter) REASON FOR VISIT: new consult    HISTORICAL  CHIEF COMPLAINT:  Chief Complaint  Patient presents with  . NP  Domenica Fail, PA  . Numbness    Since end of January, diagnosed with diabetes (had generalized joint pain).  Is better now taking metformin 1073m po bid, but still with R thumb pain with bilaterral finger numbness, L thumb numbness, joints lock up sometimes.  GShirlee Limerick [place]pre[place].     HISTORY OF PRESENT ILLNESS:   42year old female here for evaluation of numbness and tingling.  History of diabetes.  Since 2018 patient has had intermittent burning and tingling sensations, shocklike sensation, and her fingers and wrist.  She describes symptoms mainly in her third digit bilaterally, left thumb and bilateral wrists.  Since January 2019 symptoms have been more consistent.  Patient was recommended to try carpal tunnel response but she has not had a chance to purchase these yet.  Patient denies any neck pain, facial numbness, lower extremity numbness or tingling.  No specific alleviating or aggravating factors.   REVIEW OF SYSTEMS: Full 14 system review of systems performed and negative with exception of: Joint pain numbness and anemia.  Diabetes.  ALLERGIES: Allergies  Allergen Reactions  . Gabapentin Rash    HOME MEDICATIONS: Outpatient Medications Prior to Visit  Medication Sig Dispense Refill  . Calcium Carbonate-Vitamin D (CALCIUM 500 + D PO) Take 1 tablet by mouth daily.    . ferrous sulfate 325 (65 FE) MG EC tablet Take 1 tablet (325 mg total) by mouth 3 (three) times daily with meals. 90 tablet 0  . metFORMIN (GLUCOPHAGE) 1000 MG tablet   5  . acetaminophen-codeine (TYLENOL #3) 300-30 MG tablet Take 1 tablet by mouth every 8 (eight) hours as needed for moderate pain. (Patient not taking: Reported on  09/10/2017) 60 tablet 0  . blood glucose meter kit and supplies KIT Dispense based on patient and insurance preference. Use up to four times daily as directed. (FOR ICD-9 250.00, 250.01). (Patient not taking: Reported on 09/10/2017) 1 each 0  . gabapentin (NEURONTIN) 300 MG capsule Take 1 capsule (300 mg total) by mouth 2 (two) times daily. (Patient not taking: Reported on 09/10/2017) 15 capsule 0  . glucose blood (TRUE METRIX BLOOD GLUCOSE TEST) test strip Use as instructed up to four time per day. (Patient not taking: Reported on 09/10/2017) 100 each 12  . hydrocortisone cream 1 % Apply to affected area 2 times daily (Patient not taking: Reported on 09/10/2017) 15 g 0  . hydrOXYzine (ATARAX/VISTARIL) 25 MG tablet Take 1 tablet (25 mg total) by mouth at bedtime. (Patient not taking: Reported on 09/10/2017) 7 tablet 0  . naproxen (NAPROSYN) 500 MG tablet Take 1 tablet (500 mg total) by mouth 2 (two) times daily with a meal. (Patient not taking: Reported on 09/10/2017) 30 tablet 0  . senna-docusate (SENOKOT-S) 8.6-50 MG tablet Take 1 tablet by mouth daily. (Patient not taking: Reported on 09/10/2017) 30 tablet 0  . TRUEPLUS LANCETS 28G MISC USE AS DIRECTED UP TO 4 TIMES DAILY. (Patient not taking: Reported on 09/10/2017) 100 each 11   No facility-administered medications prior to visit.     PAST MEDICAL HISTORY: Past Medical History:  Diagnosis Date  . Diabetes mellitus without complication ([place]     PAST SURGICAL HISTORY: Past Surgical History:  Procedure Laterality Date  . CESAREAN SECTION  x 3    FAMILY HISTORY: Family History  Problem Relation Age of Onset  . Hypertension Mother   . Diabetes Mother     SOCIAL HISTORY:  Social History   Socioeconomic History  . Marital status: Married    Spouse name: Not on file  . Number of children: Not on file  . Years of education: Not on file  . Highest education level: Not on file  Occupational History  . Not on file  Social Needs  . Financial  resource strain: Not on file  . Food insecurity:    Worry: Not on file    Inability: Not on file  . Transportation needs:    Medical: Not on file    Non-medical: Not on file  Tobacco Use  . Smoking status: Never Smoker  . Smokeless tobacco: Never Used  Substance and Sexual Activity  . Alcohol use: No  . Drug use: No  . Sexual activity: Yes  Lifestyle  . Physical activity:    Days per week: Not on file    Minutes per session: Not on file  . Stress: Not on file  Relationships  . Social connections:    Talks on phone: Not on file    Gets together: Not on file    Attends religious service: Not on file    Active member of club or organization: Not on file    Attends meetings of clubs or organizations: Not on file    Relationship status: Not on file  . Intimate partner violence:    Fear of current or ex partner: Not on file    Emotionally abused: Not on file    Physically abused: Not on file    Forced sexual activity: Not on file  Other Topics Concern  . Not on file  Social History Narrative   Lives home with husband and 3 children.  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  Caffeine a little daily.       PHYSICAL EXAM  GENERAL EXAM/CONSTITUTIONAL: Vitals:  Vitals:   09/10/17 1021  BP: 100/64  Pulse: 72  Weight: 147 lb 12.8 oz (67 kg)  Height: '5\' 3"'  (1.6 m)     Body mass index is 26.18 kg/m.  Visual Acuity Screening   Right eye Left eye Both eyes  Without correction: 20/30 20/20   With correction:        Patient is in no distress; well developed, nourished and groomed; neck is supple  CARDIOVASCULAR:  Examination of carotid arteries is normal; no carotid bruits  Regular rate and rhythm, no murmurs  Examination of peripheral vascular system by observation and palpation is normal  EYES:  Ophthalmoscopic exam of optic discs and posterior segments is normal; no papilledema or hemorrhages  MUSCULOSKELETAL:  Gait, strength, tone, movements noted  in Neurologic exam below  NEUROLOGIC: MENTAL STATUS:  No flowsheet data found.  awake, alert, oriented to person, place and time  recent and remote memory intact  normal attention and concentration  language fluent, comprehension intact, naming intact,   fund of knowledge appropriate  CRANIAL NERVE:   2nd - no papilledema on fundoscopic exam  2nd, 3rd, 4th, 6th - pupils equal and reactive to light, visual fields full to confrontation, extraocular muscles intact, no nystagmus  5th - facial sensation symmetric  7th - facial strength symmetric  8th - hearing intact  9th - palate elevates symmetrically, uvula midline  11th - shoulder shrug symmetric  12th - tongue  protrusion midline  MOTOR:   normal bulk and tone, full strength in the BUE, BLE  SENSORY:   normal and symmetric to light touch, pinprick, temperature, vibration; EXCEPT DECR PP IN BILATERAL DIGIT 3; DECR IN BILATERAL THUMB TIPS  POSITIVE PHALENS AND TINELS  COORDINATION:   finger-nose-finger, fine finger movements normal  REFLEXES:   deep tendon reflexes present and symmetric  GAIT/STATION:   narrow based gait    DIAGNOSTIC DATA (LABS, IMAGING, TESTING) - I reviewed patient records, labs, notes, testing and imaging myself where available.  Lab Results  Component Value Date   WBC 4.5 07/29/2017   HGB 10.9 (L) 07/29/2017   HCT 35.6 07/29/2017   MCV 81 07/29/2017   PLT 262 07/29/2017      Component Value Date/Time   NA 129 (L) 07/09/2017 0936   K 3.5 07/09/2017 0936   CL 94 (L) 07/09/2017 0936   CO2 20 07/09/2017 0936   GLUCOSE 193 (H) 07/09/2017 0936   GLUCOSE 291 (H) 07/07/2017 1007   BUN 13 07/09/2017 0936   CREATININE 0.68 07/09/2017 0936   CALCIUM 8.4 (L) 07/09/2017 0936   PROT 7.3 07/29/2017 0910   ALBUMIN 3.3 (L) 07/09/2017 0936   AST 16 07/09/2017 0936   ALT 13 07/09/2017 0936   ALKPHOS 79 07/09/2017 0936   BILITOT 0.5 07/09/2017 0936   GFRNONAA 108 07/09/2017 0936    GFRAA 125 07/09/2017 0936   No results found for: CHOL, HDL, LDLCALC, LDLDIRECT, TRIG, CHOLHDL Lab Results  Component Value Date   HGBA1C 10.6 07/09/2017   No results found for: VITAMINB12 Lab Results  Component Value Date   TSH 1.230 07/09/2017        ASSESSMENT AND PLAN  42 y.o. year old female here with intermittent numbness and tingling in hands since 2018, worsening in January 2019, with signs and symptoms consistent with carpal tunnel syndrome.  Will pursue further workup and treatment.  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 Bilateral hand numbness      PLAN:  - use wrist splints at bedtime - return for EMG/NCS; then may consider hand surgery evaluation  Orders Placed This Encounter  Procedures  . NCV with EMG(electromyography)   Return for for NCV/EMG.    Penni Bombard, MD 0/0/8676, 19:50 AM Certified in Neurology, Neurophysiology and Neuroimaging  [facility] [address] [place], [place] [address] [phone]

## 2017-09-13 ENCOUNTER — Ambulatory Visit (INDEPENDENT_AMBULATORY_CARE_PROVIDER_SITE_OTHER): Payer: Self-pay | Admitting: Physician Assistant

## 2017-09-14 ENCOUNTER — Telehealth (INDEPENDENT_AMBULATORY_CARE_PROVIDER_SITE_OTHER): Payer: Self-pay | Admitting: Physician Assistant

## 2017-09-21 MED FILL — TRUE METRIX TEST STRIP: 25 days supply | Qty: 100 | Fill #1

## 2017-09-21 MED FILL — TRUEplus LANCETS 28G MISC: 25 days supply | Qty: 100 | Fill #0

## 2017-10-14 ENCOUNTER — Ambulatory Visit (INDEPENDENT_AMBULATORY_CARE_PROVIDER_SITE_OTHER): Payer: Self-pay | Admitting: Diagnostic Neuroimaging

## 2017-10-14 ENCOUNTER — Encounter (INDEPENDENT_AMBULATORY_CARE_PROVIDER_SITE_OTHER): Payer: Self-pay | Admitting: Diagnostic Neuroimaging

## 2017-10-14 DIAGNOSIS — Z0289 Encounter for other administrative examinations: Secondary | ICD-10-CM

## 2017-10-14 DIAGNOSIS — R200 Anesthesia of skin: Secondary | ICD-10-CM

## 2017-10-15 NOTE — Procedures (Signed)
[facility]  NCS (NERVE CONDUCTION STUDY) WITH EMG (ELECTROMYOGRAPHY) REPORT   STUDY DATE: 10/14/17 PATIENT NAME: [name] DOB: 13-Nov-1975 MRN: [id number]  ORDERING CLINICIAN: Joycelyn Schmid, MD   TECHNOLOGIST: Charlesetta Ivory ELECTROMYOGRAPHER: Glenford Bayley. Kaesha Kirsch, MD  CLINICAL INFORMATION: 42 year old female with bilateral hand numbness.  FINDINGS: NERVE CONDUCTION STUDY: Left median and bilateral ulnar motor responses are normal.  Right median motor response has normal distal latency, decreased amplitude, normal conduction velocity.  Bilateral median to ulnar transcarpal comparison studies show prolonged peak latency difference.  Bilateral median and bilateral ulnar sensory responses are normal.   NEEDLE ELECTROMYOGRAPHY:  Needle examination of right upper extremity deltoid, biceps, triceps, flexor carpi radialis, first dorsal interosseous is normal.   IMPRESSION:   Abnormal study demonstrating: - Mild bilateral median neuropathies at the wrist consistent with mild bilateral carpal tunnel syndrome.    INTERPRETING PHYSICIAN:  Suanne Marker, MD Certified in Neurology, Neurophysiology and Neuroimaging  [facility] [address] [place], [place] [address] [phone]  [facility]    Nerve / Sites Muscle Latency Ref. Amplitude Ref. Rel Amp Segments Distance Velocity Ref. Area    ms ms mV mV %  cm m/s m/s mVms  L Median - APB     Wrist APB 3.7 ?4.4 4.9 ?4.0 100 Wrist - APB 7   16.4     Upper arm APB 7.7  5.1  103 Upper arm - Wrist 20 50 ?49 16.5  R Median - APB     Wrist APB 4.0 ?4.4 3.2 ?4.0 100 Wrist - APB 7   7.7     Upper arm APB 8.1  3.2  98.8 Upper arm - Wrist 20 49 ?49 8.3  L Ulnar - ADM     Wrist ADM 2.3 ?3.3 7.3 ?6.0 100 Wrist - ADM 7   26.5     B.Elbow ADM 5.9  6.9  95 B.Elbow - Wrist 21 58 ?49 26.3     A.Elbow ADM 7.5  6.8  98.9 A.Elbow - B.Elbow 10 62 ?49 25.7         A.Elbow - Wrist       R Ulnar - ADM     Wrist ADM 2.2 ?3.3 6.7 ?6.0 100 Wrist - ADM 7   20.8     B.Elbow ADM 5.7  6.1  90.9 B.Elbow - Wrist 21 60 ?49 20.1     A.Elbow ADM 7.4  5.5  90.9 A.Elbow - B.Elbow 10 58 ?49 19.1         A.Elbow - Wrist                 SNC    Nerve / Sites Rec. Site Peak Lat Ref.  Amp Ref. Segments Distance Peak Diff Ref.    ms ms V V  cm ms ms  L Median, Ulnar - Transcarpal comparison     Median Palm Wrist 2.4 ?2.2 35 ?35 Median Palm - Wrist 8       Ulnar Palm Wrist 1.9 ?2.2 17 ?12 Ulnar Palm - Wrist 8          Median Palm - Ulnar Palm  0.6 ?0.4  R Median, Ulnar - Transcarpal comparison     Median Palm Wrist 2.6 ?2.2 35 ?35 Median Palm - Wrist 8       Ulnar Palm Wrist 1.9 ?2.2 15 ?12 Ulnar Palm - Wrist 8          Median Palm - Ulnar  Palm  0.7 ?0.4  L Median - Orthodromic (Dig II, Mid palm)     Dig II Wrist 3.2 ?3.4 12 ?10 Dig II - Wrist 13    R Median - Orthodromic (Dig II, Mid palm)     Dig II Wrist 3.4 ?3.4 10 ?10 Dig II - Wrist 13    L Ulnar - Orthodromic, (Dig V, Mid palm)     Dig V Wrist 2.3 ?3.1 10 ?5 Dig V - Wrist 11    R Ulnar - Orthodromic, (Dig V, Mid palm)     Dig V Wrist 2.3 ?3.1 8 ?5 Dig V - Wrist 14                   F  Wave    Nerve F Lat Ref.   ms ms  L Ulnar - ADM 26.3 ?32.0  R Ulnar - ADM 26.9 ?32.0         EMG full       EMG Summary Table    Spontaneous MUAP Recruitment  Muscle IA Fib PSW Fasc Other Amp Dur. Poly Pattern  R. Deltoid Normal None None None _______ Normal Normal Normal Normal  R. Biceps brachii Normal None None None _______ Normal Normal Normal Normal  R. Triceps brachii Normal None None None _______ Normal Normal Normal Normal  R. Flexor carpi radialis Normal None None None _______ Normal Normal Normal Normal  R. First dorsal interosseous Normal None None None _______ Normal Normal Normal Normal

## 2017-10-20 ENCOUNTER — Encounter (INDEPENDENT_AMBULATORY_CARE_PROVIDER_SITE_OTHER): Payer: Self-pay | Admitting: Nurse Practitioner

## 2017-10-20 ENCOUNTER — Other Ambulatory Visit: Payer: Self-pay

## 2017-10-20 ENCOUNTER — Ambulatory Visit (INDEPENDENT_AMBULATORY_CARE_PROVIDER_SITE_OTHER): Payer: Self-pay | Admitting: Nurse Practitioner

## 2017-10-20 ENCOUNTER — Ambulatory Visit (INDEPENDENT_AMBULATORY_CARE_PROVIDER_SITE_OTHER): Payer: Self-pay | Admitting: Physician Assistant

## 2017-10-20 VITALS — BP 109/75 | HR 66 | Temp 97.9°F | Ht 63.0 in | Wt 148.0 lb

## 2017-10-20 DIAGNOSIS — E119 Type 2 diabetes mellitus without complications: Secondary | ICD-10-CM

## 2017-10-20 DIAGNOSIS — N6320 Unspecified lump in the left breast, unspecified quadrant: Secondary | ICD-10-CM

## 2017-10-20 LAB — POCT GLYCOSYLATED HEMOGLOBIN (HGB A1C): Hemoglobin A1C: 5.6

## 2017-10-20 NOTE — Progress Notes (Signed)
Assessment & Plan:  [name] was seen today for follow-up.  Diagnoses and all orders for this visit:  Type 2 diabetes mellitus without complication, without long-term current use of insulin (HCC) -     HgB A1c -     CMP14+EGFR -     Microalbumin/Creatinine Ratio, Urine -     Lipid panel Continue blood sugar control as discussed in office today, low carbohydrate diet, and regular physical exercise as tolerated, 150 minutes per week (30 min each day, 5 days per week, or 50 min 3 days per week). Keep blood sugar logs with fasting goal of 80-130 mg/dl, post prandial less than 180.  For Hypoglycemia: BS <60 and Hyperglycemia BS >400; contact the clinic ASAP. Annual eye exams and foot exams are recommended.  Left breast mass Patient is non insured. She has been referred to the breast clinic for diagnostic mammogram    Patient has been counseled on age-appropriate routine health concerns for screening and prevention. These are reviewed and up-to-date. Referrals have been placed accordingly. Immunizations are up-to-date or declined.    Subjective:   Chief Complaint  Patient presents with  . Follow-up    DM   HPI [name] 42 y.o. female presents to office today for follow up for DM.   Diabetes Mellitus Type 2 Chronic. MUCH Improved. Walking/Exercising more. A1c down from 10.6 to 5.6!!!  She has changed her diet. Weight is stable. She is taking metformin as prescribed. Will decrease metformin to once a day at this time. Recheck A1c in 3 months. If increased she will resume metformin twice a day.  She denies any hypo or hyperglycemic symptoms. She is due for eye exam.She is checking her blood sugars daily. Average readings 90-130s.  Lab Results  Component Value Date   HGBA1C 5.6 10/20/2017    Left Breast Mass She is overdue for mammogram. She endorses left breast lump. The lump has been present for over 7 years. She had an US of the left breast 7 years ago:  Impression:   Benign findings left breast. The palpable area of concern is dermal in position, either  a sebaceous cyst or epidermal inclusion cyst. No further evaluation is warranted.  Review of Systems  Constitutional: Negative for fever, malaise/fatigue and weight loss.  HENT: Negative.  Negative for nosebleeds.   Eyes: Negative.  Negative for blurred vision, double vision and photophobia.  Respiratory: Negative.  Negative for cough and shortness of breath.   Cardiovascular: Negative.  Negative for chest pain, palpitations and leg swelling.  Gastrointestinal: Negative.  Negative for heartburn, nausea and vomiting.  Musculoskeletal: Negative.  Negative for myalgias.  Skin:       Left breast mass  Neurological: Negative.  Negative for dizziness, focal weakness, seizures and headaches.  Psychiatric/Behavioral: Negative.  Negative for suicidal ideas.    Past Medical History:  Diagnosis Date  . Diabetes mellitus without complication [facility])     Past Surgical History:  Procedure Laterality Date  . CESAREAN SECTION     x 3    Family History  Problem Relation Age of Onset  . Hypertension Mother   . Diabetes Mother     Social History Reviewed with no changes to be made today.   Outpatient Medications Prior to Visit  Medication Sig Dispense Refill  . Calcium Carbonate-Vitamin D (CALCIUM 500 + D PO) Take 1 tablet by mouth daily.    . ferrous sulfate 325 (65 FE) MG EC tablet Take 1 tablet (325 mg total)  by mouth 3 (three) times daily with meals. 90 tablet 0  . glucose blood test strip USE AS INSTRUCTED UP TO FOUR TIME PER DAY  12  . metFORMIN (GLUCOPHAGE) 1000 MG tablet   5   No facility-administered medications prior to visit.     Allergies  Allergen Reactions  . Gabapentin Rash       Objective:    BP 109/75 (BP Location: Left Arm, Patient Position: Sitting, Cuff Size: Normal)   Pulse 66   Temp 97.9 F (36.6 C) (Oral)   Ht 5' 3"  (1.6 m)   Wt 148 lb (67.1 kg)   LMP 10/13/2017  (Exact Date)   SpO2 99%   BMI 26.22 kg/m  Wt Readings from Last 3 Encounters:  10/20/17 148 lb (67.1 kg)  09/10/17 147 lb 12.8 oz (67 kg)  08/17/17 148 lb 12.8 oz (67.5 kg)    Physical Exam  Constitutional: She is oriented to person, place, and time. She appears well-developed and well-nourished. She is cooperative.  HENT:  Head: Normocephalic and atraumatic.  Eyes: EOM are normal.  Neck: Normal range of motion.  Cardiovascular: Normal rate, regular rhythm and normal heart sounds. Exam reveals no gallop and no friction rub.  No murmur heard. Pulmonary/Chest: Effort normal and breath sounds normal. No tachypnea. No respiratory distress. She has no decreased breath sounds. She has no wheezes. She has no rhonchi. She has no rales. She exhibits no tenderness. Right breast exhibits no inverted nipple, no mass, no nipple discharge, no skin change and no tenderness. Left breast exhibits mass. Left breast exhibits no inverted nipple, no nipple discharge, no skin change and no tenderness. No breast swelling, tenderness, discharge or bleeding. Breasts are symmetrical.    Abdominal: Bowel sounds are normal.  Musculoskeletal: Normal range of motion. She exhibits no edema.  Neurological: She is alert and oriented to person, place, and time. Coordination normal.  Skin: Skin is warm and dry. No rash noted. No erythema. No pallor.  Psychiatric: She has a normal mood and affect. Her behavior is normal. Judgment and thought content normal.  Nursing note and vitals reviewed.      Patient has been counseled extensively about nutrition and exercise as well as the importance of adherence with medications and regular follow-up. The patient was given clear instructions to go to ER or return to medical center if symptoms don't improve, worsen or new problems develop. The patient verbalized understanding.   Follow-up: Return in about 3 months (around 01/20/2018) for DM.   Julia Pounds, FNP-BC [facility], [place]   10/20/2017, 12:05 PM

## 2017-10-21 ENCOUNTER — Other Ambulatory Visit (HOSPITAL_COMMUNITY): Payer: Self-pay | Admitting: *Deleted

## 2017-10-21 DIAGNOSIS — N6310 Unspecified lump in the right breast, unspecified quadrant: Secondary | ICD-10-CM

## 2017-10-21 LAB — MICROALBUMIN / CREATININE URINE RATIO
CREATININE, UR: 36 mg/dL
Microalbumin, Urine: <3 ug/mL

## 2017-10-21 LAB — CMP14+EGFR
A/G RATIO: 1.6 (ref 1.2–2.2)
ALK PHOS: 65 IU/L (ref 39–117)
ALT: 20 IU/L (ref 0–32)
AST: 24 IU/L (ref 0–40)
Albumin: 4.3 g/dL (ref 3.5–5.5)
BILIRUBIN TOTAL: 0.8 mg/dL (ref 0.0–1.2)
BUN / CREAT RATIO: 16 (ref 9–23)
BUN: 9 mg/dL (ref 6–24)
CHLORIDE: 100 mmol/L (ref 96–106)
CO2: 25 mmol/L (ref 20–29)
Calcium: 9.4 mg/dL (ref 8.7–10.2)
Creatinine, Ser: 0.55 mg/dL — ABNORMAL LOW (ref 0.57–1.00)
GFR calc non Af Amer: 116 mL/min/{1.73_m2} (ref >59)
GFR, EST AFRICAN AMERICAN: 134 mL/min/{1.73_m2} (ref >59)
GLUCOSE: 121 mg/dL — AB (ref 65–99)
Globulin, Total: 2.7 g/dL (ref 1.5–4.5)
POTASSIUM: 4.1 mmol/L (ref 3.5–5.2)
Sodium: 138 mmol/L (ref 134–144)
TOTAL PROTEIN: 7 g/dL (ref 6.0–8.5)

## 2017-10-21 LAB — LIPID PANEL
CHOL/HDL RATIO: 3.3 ratio (ref 0.0–4.4)
CHOLESTEROL TOTAL: 181 mg/dL (ref 100–199)
HDL: 55 mg/dL (ref >39)
LDL Calculated: 107 mg/dL — ABNORMAL HIGH (ref 0–99)
TRIGLYCERIDES: 97 mg/dL (ref 0–149)
VLDL Cholesterol Cal: 19 mg/dL (ref 5–40)

## 2017-11-04 ENCOUNTER — Encounter (HOSPITAL_COMMUNITY): Payer: Self-pay

## 2017-11-04 ENCOUNTER — Ambulatory Visit
Admission: RE | Admit: 2017-11-04 | Discharge: 2017-11-04 | Disposition: A | Discharge status: Home / Self Care | Payer: No Typology Code available for payment source | Source: Ambulatory Visit | Attending: Obstetrics and Gynecology | Admitting: Obstetrics and Gynecology

## 2017-11-04 ENCOUNTER — Ambulatory Visit (HOSPITAL_COMMUNITY)
Admission: RE | Admit: 2017-11-04 | Discharge: 2017-11-04 | Disposition: A | Discharge status: Home / Self Care | Payer: Self-pay | Source: Ambulatory Visit | Attending: Obstetrics and Gynecology | Admitting: Obstetrics and Gynecology

## 2017-11-04 ENCOUNTER — Other Ambulatory Visit (HOSPITAL_COMMUNITY): Payer: Self-pay | Admitting: *Deleted

## 2017-11-04 ENCOUNTER — Ambulatory Visit: Admission: RE | Admit: 2017-11-04 | Payer: Self-pay | Source: Ambulatory Visit

## 2017-11-04 VITALS — BP 108/64 | Ht 63.0 in

## 2017-11-04 DIAGNOSIS — N6320 Unspecified lump in the left breast, unspecified quadrant: Secondary | ICD-10-CM

## 2017-11-04 DIAGNOSIS — Z01419 Encounter for gynecological examination (general) (routine) without abnormal findings: Secondary | ICD-10-CM

## 2017-11-04 DIAGNOSIS — N6310 Unspecified lump in the right breast, unspecified quadrant: Secondary | ICD-10-CM

## 2017-11-04 DIAGNOSIS — N6325 Unspecified lump in the left breast, overlapping quadrants: Secondary | ICD-10-CM

## 2017-11-04 NOTE — Progress Notes (Signed)
Complaints of a left breast lump x 7 years that patient states has grown slightly.   Pap Smear: Pap smear completed today. Last Pap smear was 11/26/2011 at [facility] and normal. Per patient has no history of an abnormal Pap smear. Last Pap smear result is in Epic.  Physical exam: Breasts Breasts symmetrical. No skin abnormalities bilateral breasts. No nipple retraction right breast. Left nipple inverted that per patient is normal for her. No nipple discharge bilateral breasts. No lymphadenopathy. No lumps palpated right breast. Palpated a lump within the left breast at 9 o'clock 11.5 cm from the nipple. No complaints of pain or tenderness on exam. Referred patient to the [facility] for a diagnostic mammogram and left breast ultrasound. Appointment scheduled for Thursday, Nov 04, 2017 at 0920.        Pelvic/Bimanual   Ext Genitalia No lesions, no swelling and no discharge observed on external genitalia.         Vagina Vagina pink and normal texture. No lesions or discharge observed in vagina.          Cervix Cervix is present. Cervix pink and of normal texture. No discharge observed.     Uterus Uterus is present and palpable. Uterus in normal position and normal size.        Adnexae Bilateral ovaries present and palpable. No tenderness on palpation.         Rectovaginal No rectal exam completed today since patient had no rectal complaints. No skin abnormalities observed on exam.    Smoking History: Patient has never smoked.  Patient Navigation: Patient education provided. Access to services provided for patient through [facility] program. Spanish interpreter provided.   Breast and Cervical Cancer Risk Assessment: Patient has no family history of breast cancer, known genetic mutations, or radiation treatment to the chest before age 63. Patient has no history of cervical dysplasia, immunocompromised, or DES exposure in-utero. Patient has a 5-year risk for breast cancer at 0.4%  and a lifetime risk at 7.1%.  Used Spanish interpreter [employer] from [place].

## 2017-11-04 NOTE — Patient Instructions (Signed)
Explained breast self awareness with [name]. Let patient know [facility] will cover Pap smears and HPV typing every 5 years unless has a history of abnormal Pap smears. Referred patient to the [facility] for a diagnostic mammogram and left breast ultrasound. Appointment scheduled for Thursday, Nov 04, 2017 at 0920. Let patient know will follow up with her within the next couple weeks with results of Pap smear by letter or phone. [name] verbalized understanding.  Naraya Stoneberg, Kathaleen Maser, RN 9:20 AM

## 2017-11-05 ENCOUNTER — Telehealth (INDEPENDENT_AMBULATORY_CARE_PROVIDER_SITE_OTHER): Payer: Self-pay

## 2017-11-05 NOTE — Telephone Encounter (Signed)
Called patient using pacific interpreter [phone]) patient is aware that kidneys are not being affected by her diabetes at this time. Labs are normal. Advised to eat a low fat heart healthy diet, drink 48 oz of water daily and participate in regular aerobic exercise by exercising at least 150 minutes a week. Avoid red meat, fried foods, sodas, sugary drinks, unhealthy snacking, alcohol/smoking. Patient expressed understanding. Tempestt Budd PalmerS Roberts

## 2017-11-05 NOTE — Telephone Encounter (Signed)
-----   Message from Claiborne Rigg, NP sent at 11/01/2017  4:30 PM EDT ----- Your urine does not show that your kidneys are being affected by your diabetes at this time. Labs are essentially normal. Make sure you are drinking at least 48 oz of water per day. Work on eating a low fat, heart healthy diet and participate in regular aerobic exercise program to control as well. Exercise at least 150 minutes per week.  Avoid red meat, fried foods. No junk foods, sodas, sugary drinks, unhealthy snacking, alcohol or smoking.

## 2017-11-08 LAB — CYTOLOGY - PAP
Diagnosis: NEGATIVE
HPV (WINDOPATH): NOT DETECTED

## 2017-11-19 ENCOUNTER — Ambulatory Visit: Payer: Self-pay | Admitting: Surgery

## 2017-11-19 ENCOUNTER — Ambulatory Visit (INDEPENDENT_AMBULATORY_CARE_PROVIDER_SITE_OTHER): Payer: Self-pay | Admitting: Physician Assistant

## 2017-11-19 NOTE — H&P (Signed)
[name] Documented: 11/19/2017 11:11 AM Location: [facility] Patient #: [id number] DOB: 04/30/1976 Married / Language: [place] / Race: White Female  History of Present Illness [name] Delarocha MD; 11/19/2017 11:27 AM) Patient words: Patient sent at the request of Dr. Linde Gillis for a cyst involving her left medial breast. It has been present for 7 years. She had one episode of redness and drainage 6 years ago. He has not been causing any symptoms recently. She was evaluated at the breast center of the cyst was examined by ultrasound. There are no other abnormalities in either breast noted. Denies any history of breast pain nipple discharge or other symptoms involving her breast. A translator was available for use.         ADDENDUM REPORT: 11/05/2017 09:09 ADDENDUM: Per patient request, surgical consultation has been rescheduled with Dr. Harriette Bouillon at [facility] on November 19, 2017 for the probably benign complicated epidermal inclusion cyst of LEFT breast. The patient has been informed of the date and time of the appointment by Durene Cal, Bilingual Patient Services Representative. The patient agrees to date and time of this appointment. Reported by Phylliss Bob, RN Electronically Signed By: Bary Richard M.D. On: 11/05/2017 09:09 Addended by Corky Sox, MD on 11/05/2017 9:32 AM ADDENDUM REPORT: 11/04/2017 15:44 ADDENDUM: Surgical consultation has been arranged with Dr. Harriette Bouillon at [facility] on 11/09/17. Patient has been informed of date and time of the appointment by Durene Cal, Bilingual Patient Services Representative. Reported by Phylliss Bob, RN Electronically Signed By: Bary Richard M.D. On: 11/04/2017 15:44 Addended by Corky Sox, MD on 11/04/2017 3:57 PM  Study Result CLINICAL DATA: Palpable lump within the inner LEFT breast for 1 month. EXAM: DIGITAL DIAGNOSTIC  BILATERAL MAMMOGRAM WITH CAD AND TOMO ULTRASOUND LEFT BREAST COMPARISON: Previous LEFT breast ultrasound dated 09/24/2010. No previous mammograms. ACR Breast Density Category c: The breast tissue is heterogeneously dense, which may obscure small masses. FINDINGS: Bilateral CC and MLO views were obtained today, with additional 3D tomosynthesis, and with additional spot compression view of the inner LEFT breast corresponding to the area of clinical concern, with overlying skin marker in place. There is an oval circumscribed low-density mass within the inner LEFT breast, at superficial depth, possibly within the skin, measuring approximately 1.6 cm, corresponding to the palpable lump. There are no additional dominant masses, suspicious calcifications or secondary signs of malignancy within either breast. Mammographic images were processed with CAD. Targeted ultrasound is performed, showing an oval circumscribed mixed echogenicity mass within the LEFT breast at the 9 o'clock axis, 12 cm from the nipple (parasternal), measuring 1.8 x 1.1 x 1.7 cm, avascular, at superficial depth just below the skin. No skin tract identified. IMPRESSION: 1. Probably benign complicated epidermal inclusion cyst just below the skin of the LEFT breast at the 9 o'clock axis, 12 cm from the nipple (parasternal), measuring 1.8 cm, corresponding to the area of clinical concern. Recommend surgical excision for definitive treatment and to ensure benignity. Ultrasound-guided core biopsy is not recommended due to the possibility of causing chemical mastitis and/or abscess. 2. No evidence of malignancy within the RIGHT breast. RECOMMENDATION: Surgical excision of the superficial mass within the LEFT breast mass, presumed complicated epidermal inclusion cyst. Patient will be scheduled for surgical consultation with [facility]. Findings discussed with [name], RN, with [facility] at the time of today's exam. I have  discussed the findings and recommendations with the patient. Results were also provided in writing at the conclusion  of the visit. If applicable, a reminder letter will be sent to the patient regarding the next appointment. BI-RADS CATEGORY 3: Probably benign. Electronically Signed: By: Bary RichardStan Maynard M.D. On: 11/04/2017 11:06.  The patient is a 42 year old female.   Past Surgical History [name]([name]; 11/19/2017 11:11 AM) Cesarean Section - Multiple  Diagnostic Studies History [name]([name]; 11/19/2017 11:11 AM) Colonoscopy never  Allergies [name]([name]; 11/19/2017 11:12 AM) No Known Drug Allergies [11/19/2017]: Allergies Reconciled  Medication History [name]([name]; 11/19/2017 11:12 AM) Calcium Carbonate (600MG  Tablet, Oral) Active.  Social History [name]([name]; 11/19/2017 11:11 AM) Caffeine use Coffee.  Family History [name]([name]; 11/19/2017 11:11 AM) Diabetes Mellitus Mother.  Pregnancy / Birth History [name]([name]; 11/19/2017 11:11 AM) Age at menarche 14 years. Gravida 5 Length (months) of breastfeeding 7-12 Maternal age 42-25 Para 3 Regular periods  Other Problems [name]([name]; 11/19/2017 11:11 AM) No pertinent past medical history     Review of Systems [name]([name]; 11/19/2017 11:12 AM) General Present- Chills, Night Sweats and Weight Loss. Not Present- Appetite Loss, Fatigue, Fever and Weight Gain. Skin Present- Dryness. Not Present- Change in Wart/Mole, Hives, Jaundice, New Lesions, Non-Healing Wounds, Rash and Ulcer. HEENT Present- Earache, Ringing in the Ears and Visual Disturbances. Not Present- Hearing Loss, Hoarseness, Nose Bleed, Oral Ulcers, Seasonal Allergies, Sinus Pain, Sore Throat, Wears glasses/contact lenses and Yellow Eyes. Breast Present- Breast Mass and Nipple Discharge. Not Present- Breast Pain and Skin Changes. Cardiovascular Present- Leg Cramps. Not Present- Chest Pain, Difficulty Breathing Lying Down, Palpitations,  Rapid Heart Rate, Shortness of Breath and Swelling of Extremities. Gastrointestinal Present- Bloating. Not Present- Abdominal Pain, Bloody Stool, Change in Bowel Habits, Chronic diarrhea, Constipation, Difficulty Swallowing, Excessive gas, Gets full quickly at meals, Hemorrhoids, Indigestion, Nausea, Rectal Pain and Vomiting. Musculoskeletal Present- Back Pain, Joint Pain and Muscle Weakness. Not Present- Joint Stiffness, Muscle Pain and Swelling of Extremities. Neurological Present- Decreased Memory, Headaches, Tremor and Weakness. Not Present- Fainting, Numbness, Seizures, Tingling and Trouble walking. Psychiatric Present- Anxiety. Not Present- Bipolar, Change in Sleep Pattern, Depression, Fearful and Frequent crying. Endocrine Present- Cold Intolerance, Hot flashes and New Diabetes. Not Present- Excessive Hunger, Hair Changes and Heat Intolerance. Hematology Present- Easy Bruising. Not Present- Blood Thinners, Excessive bleeding, Gland problems, HIV and Persistent Infections.  Vitals [name]([name]; 11/19/2017 11:13 AM) 11/19/2017 11:12 AM Weight: 151 lb Height: 62in Body Surface Area: 1.7 m Body Mass Index: 27.62 kg/m  Temp.: 98.75F(Oral)  Pulse: 89 (Regular)  BP: 110/72 (Sitting, Left Arm, Standard)      Physical Exam (Anas Reister A. Morrissa Shein MD; 11/19/2017 11:27 AM)  General Mental Status-Alert. General Appearance-Consistent with stated age. Hydration-Well hydrated. Voice-Normal.  Head and Neck Head-normocephalic, atraumatic with no lesions or palpable masses. Trachea-midline. Thyroid Gland Characteristics - normal size and consistency.  Breast Note: Left medial breast is a 2 cm x 1 cm epidermal inclusion cyst which is not inflamed. There are no other breast masses.  Neurologic Neurologic evaluation reveals -alert and oriented x 3 with no impairment of recent or remote memory. Mental Status-Normal.  Musculoskeletal Normal Exam - Left-Upper  Extremity Strength Normal and Lower Extremity Strength Normal. Normal Exam - Right-Upper Extremity Strength Normal and Lower Extremity Strength Normal.    Assessment & Plan (Dayleen Beske A. Vernis Cabacungan MD; 11/19/2017 11:25 AM)  EPIDERMAL INCLUSION CYST (L72.0) Impression: left breast pt desires excision risks and benefits discussed with translator on hand she agrees to proceed  Current Plans The pathophysiology of skin & subcutaneous masses was discussed. Natural history risks  without surgery were discussed. I recommended surgery to remove the mass. I explained the technique of removal with use of local anesthesia & possible need for more aggressive sedation/anesthesia for patient comfort.  Risks such as bleeding, infection, wound breakdown, heart attack, death, and other risks were discussed. I noted a good likelihood this will help address the problem. Possibility that this will not correct all symptoms was explained. Possibility of regrowth/recurrence of the mass was discussed. We will work to minimize complications. Questions were answered. The patient expresses understanding & wishes to proceed with surgery.  Pt Education - [facility] Free Text Education/Instructions: discussed with patient and provided information. Pt Education - [facility] General Post-op HCI

## 2017-11-24 ENCOUNTER — Encounter (HOSPITAL_COMMUNITY): Payer: Self-pay | Admitting: *Deleted

## 2017-12-13 ENCOUNTER — Other Ambulatory Visit: Payer: Self-pay

## 2017-12-13 ENCOUNTER — Encounter (HOSPITAL_BASED_OUTPATIENT_CLINIC_OR_DEPARTMENT_OTHER): Payer: Self-pay | Admitting: *Deleted

## 2017-12-13 NOTE — Progress Notes (Addendum)
Bring all medications.Pt coming Friday for BMET, EKG and to pick up Ensure. Requested spanish interpreter from [phone].

## 2017-12-15 ENCOUNTER — Encounter (HOSPITAL_COMMUNITY): Payer: Self-pay | Admitting: *Deleted

## 2017-12-15 NOTE — Progress Notes (Signed)
Letter mailed to patient with negative pap smear results. HPV was negative. Next pap smear due in five years. 

## 2017-12-17 ENCOUNTER — Encounter (HOSPITAL_BASED_OUTPATIENT_CLINIC_OR_DEPARTMENT_OTHER)
Admission: RE | Admit: 2017-12-17 | Discharge: 2017-12-17 | Disposition: A | Discharge status: Home / Self Care | Payer: No Typology Code available for payment source | Source: Ambulatory Visit | Attending: Surgery | Admitting: Surgery

## 2017-12-17 LAB — BASIC METABOLIC PANEL
Anion gap: 7 (ref 5–15)
BUN: 9 mg/dL (ref 6–20)
CO2: 25 mmol/L (ref 22–32)
Calcium: 9 mg/dL (ref 8.9–10.3)
Chloride: 107 mmol/L (ref 98–111)
Creatinine, Ser: 0.54 mg/dL (ref 0.44–1.00)
GFR calc Af Amer: >60 mL/min (ref >60)
GFR calc non Af Amer: >60 mL/min (ref >60)
GLUCOSE: 144 mg/dL — AB (ref 70–99)
POTASSIUM: 4.3 mmol/L (ref 3.5–5.1)
Sodium: 139 mmol/L (ref 135–145)

## 2017-12-17 NOTE — Progress Notes (Signed)
Ensure pre surgery drink given with instructions to complete by 0410 dos, surgical soap given with instructions, pt verbalized understanding. 

## 2017-12-20 NOTE — Pre-Procedure Instructions (Signed)
Mariel will be interpreter for pt., per Susan at [facility]; please call [phone] if surgery time changes. 

## 2017-12-21 ENCOUNTER — Encounter (HOSPITAL_BASED_OUTPATIENT_CLINIC_OR_DEPARTMENT_OTHER): Payer: Self-pay

## 2017-12-21 ENCOUNTER — Ambulatory Visit (HOSPITAL_BASED_OUTPATIENT_CLINIC_OR_DEPARTMENT_OTHER)
Admission: RE | Admit: 2017-12-21 | Discharge: 2017-12-21 | Disposition: A | Discharge status: Home / Self Care | Payer: Self-pay | Source: Ambulatory Visit | Attending: Surgery | Admitting: Surgery

## 2017-12-21 ENCOUNTER — Other Ambulatory Visit: Payer: Self-pay

## 2017-12-21 ENCOUNTER — Ambulatory Visit (HOSPITAL_BASED_OUTPATIENT_CLINIC_OR_DEPARTMENT_OTHER): Payer: Self-pay | Admitting: Certified Registered"

## 2017-12-21 ENCOUNTER — Encounter (HOSPITAL_BASED_OUTPATIENT_CLINIC_OR_DEPARTMENT_OTHER)
Admission: RE | Disposition: A | Discharge status: Home / Self Care | Payer: Self-pay | Source: Ambulatory Visit | Attending: Surgery

## 2017-12-21 DIAGNOSIS — N6002 Solitary cyst of left breast: Secondary | ICD-10-CM | POA: Insufficient documentation

## 2017-12-21 DIAGNOSIS — E119 Type 2 diabetes mellitus without complications: Secondary | ICD-10-CM | POA: Insufficient documentation

## 2017-12-21 DIAGNOSIS — Z7984 Long term (current) use of oral hypoglycemic drugs: Secondary | ICD-10-CM | POA: Insufficient documentation

## 2017-12-21 HISTORY — DX: Anemia, unspecified: D64.9

## 2017-12-21 HISTORY — PX: BREAST CYST EXCISION: SHX579

## 2017-12-21 LAB — GLUCOSE, CAPILLARY
Glucose-Capillary: 114 mg/dL — ABNORMAL HIGH (ref 70–99)
Glucose-Capillary: 92 mg/dL (ref 70–99)
Glucose-Capillary: 160 mg/dL — ABNORMAL HIGH (ref 70–99)

## 2017-12-21 SURGERY — EXCISION, CYST, BREAST
Anesthesia: General | Site: Breast | Laterality: Left

## 2017-12-21 MED ORDER — BUPIVACAINE-EPINEPHRINE (PF) 0.25% -1:200000 IJ SOLN
INTRAMUSCULAR | Status: AC
  Filled 2017-12-21: qty 180

## 2017-12-21 MED ORDER — CEFAZOLIN SODIUM-DEXTROSE 2-4 GM/100ML-% IV SOLN
INTRAVENOUS | Status: AC
  Filled 2017-12-21: qty 100

## 2017-12-21 MED ORDER — DEXTROSE 5 % IV SOLN: 3.0000 g | INTRAVENOUS | Status: DC

## 2017-12-21 MED ORDER — OXYCODONE HCL 5 MG/5ML PO SOLN: 5.0000 mg | Freq: Once | ORAL | Status: DC | PRN

## 2017-12-21 MED ORDER — DEXAMETHASONE SODIUM PHOSPHATE 10 MG/ML IJ SOLN
INTRAMUSCULAR | Status: DC | PRN
  Administered 2017-12-21: 10 mg via INTRAVENOUS

## 2017-12-21 MED ORDER — FENTANYL CITRATE (PF) 100 MCG/2ML IJ SOLN: 25.0000 ug | INTRAMUSCULAR | Status: DC | PRN

## 2017-12-21 MED ORDER — MIDAZOLAM HCL 2 MG/2ML IJ SOLN: 1.0000 mg | INTRAMUSCULAR | Status: DC | PRN

## 2017-12-21 MED ORDER — CHLORHEXIDINE GLUCONATE CLOTH 2 % EX PADS: 6.0000 | MEDICATED_PAD | Freq: Once | CUTANEOUS | Status: DC

## 2017-12-21 MED ORDER — HEPARIN SOD (PORK) LOCK FLUSH 100 UNIT/ML IV SOLN
INTRAVENOUS | Status: AC
  Filled 2017-12-21: qty 5

## 2017-12-21 MED ORDER — ONDANSETRON HCL 4 MG/2ML IJ SOLN
INTRAMUSCULAR | Status: AC
  Filled 2017-12-21: qty 2

## 2017-12-21 MED ORDER — IBUPROFEN 200 MG PO TABS: 200.0000 mg | ORAL_TABLET | Freq: Four times a day (QID) | ORAL | Status: DC | PRN

## 2017-12-21 MED ORDER — FENTANYL CITRATE (PF) 100 MCG/2ML IJ SOLN
INTRAMUSCULAR | Status: AC
  Filled 2017-12-21: qty 2

## 2017-12-21 MED ORDER — IBUPROFEN 100 MG/5ML PO SUSP: 200.0000 mg | Freq: Four times a day (QID) | ORAL | Status: DC | PRN

## 2017-12-21 MED ORDER — PROPOFOL 10 MG/ML IV BOLUS
INTRAVENOUS | Status: AC
  Filled 2017-12-21: qty 40

## 2017-12-21 MED ORDER — LIDOCAINE HCL (CARDIAC) PF 100 MG/5ML IV SOSY
PREFILLED_SYRINGE | INTRAVENOUS | Status: AC
  Filled 2017-12-21: qty 5

## 2017-12-21 MED ORDER — CEFAZOLIN SODIUM-DEXTROSE 2-4 GM/100ML-% IV SOLN: 2.0000 g | INTRAVENOUS | Status: DC

## 2017-12-21 MED ORDER — KETOROLAC TROMETHAMINE 30 MG/ML IJ SOLN: 30.0000 mg | Freq: Once | INTRAMUSCULAR | Status: DC | PRN

## 2017-12-21 MED ORDER — PROPOFOL 10 MG/ML IV BOLUS
INTRAVENOUS | Status: DC | PRN
  Administered 2017-12-21: 150 mg via INTRAVENOUS

## 2017-12-21 MED ORDER — HEPARIN (PORCINE) IN NACL 1000-0.9 UT/500ML-% IV SOLN
INTRAVENOUS | Status: AC
  Filled 2017-12-21: qty 500

## 2017-12-21 MED ORDER — ONDANSETRON HCL 4 MG/2ML IJ SOLN
INTRAMUSCULAR | Status: DC | PRN
  Administered 2017-12-21: 4 mg via INTRAVENOUS

## 2017-12-21 MED ORDER — SCOPOLAMINE 1 MG/3DAYS TD PT72: 1.0000 | MEDICATED_PATCH | Freq: Once | TRANSDERMAL | Status: DC | PRN

## 2017-12-21 MED ORDER — PHENYLEPHRINE HCL 10 MG/ML IJ SOLN
INTRAMUSCULAR | Status: DC | PRN
  Administered 2017-12-21: 120 ug via INTRAVENOUS

## 2017-12-21 MED ORDER — LACTATED RINGERS IV SOLN
INTRAVENOUS | Status: DC
  Administered 2017-12-21: 07:00:00 via INTRAVENOUS

## 2017-12-21 MED ORDER — CEFAZOLIN SODIUM-DEXTROSE 2-3 GM-%(50ML) IV SOLR
INTRAVENOUS | Status: DC | PRN
  Administered 2017-12-21: 2 g via INTRAVENOUS

## 2017-12-21 MED ORDER — MEPERIDINE HCL 25 MG/ML IJ SOLN: 6.2500 mg | INTRAMUSCULAR | Status: DC | PRN

## 2017-12-21 MED ORDER — IBUPROFEN 800 MG PO TABS: 800.0000 mg | ORAL_TABLET | Freq: Three times a day (TID) | ORAL | 0 refills | Status: DC | PRN

## 2017-12-21 MED ORDER — ONDANSETRON HCL 4 MG/2ML IJ SOLN: 4.0000 mg | Freq: Once | INTRAMUSCULAR | Status: DC | PRN

## 2017-12-21 MED ORDER — LIDOCAINE HCL (CARDIAC) PF 100 MG/5ML IV SOSY
PREFILLED_SYRINGE | INTRAVENOUS | Status: DC | PRN
  Administered 2017-12-21: 100 mg via INTRAVENOUS

## 2017-12-21 MED ORDER — MIDAZOLAM HCL 2 MG/2ML IJ SOLN
INTRAMUSCULAR | Status: AC
  Filled 2017-12-21: qty 2

## 2017-12-21 MED ORDER — FENTANYL CITRATE (PF) 100 MCG/2ML IJ SOLN
50.0000 ug | INTRAMUSCULAR | Status: DC | PRN
  Administered 2017-12-21: 100 ug via INTRAVENOUS

## 2017-12-21 MED ORDER — BUPIVACAINE-EPINEPHRINE 0.25% -1:200000 IJ SOLN
INTRAMUSCULAR | Status: DC | PRN
  Administered 2017-12-21: 10 mL

## 2017-12-21 MED ORDER — OXYCODONE HCL 5 MG PO TABS: 5.0000 mg | ORAL_TABLET | Freq: Once | ORAL | Status: DC | PRN

## 2017-12-21 MED ORDER — DEXAMETHASONE SODIUM PHOSPHATE 10 MG/ML IJ SOLN
INTRAMUSCULAR | Status: AC
  Filled 2017-12-21: qty 1

## 2017-12-21 MED FILL — IBUPROFEN 800 MG TABLET: 800 | 10 days supply | Qty: 30 | Fill #0

## 2017-12-21 SURGICAL SUPPLY — 51 items
ADH SKN CLS APL DERMABOND .7 (GAUZE/BANDAGES/DRESSINGS) ×1
APPLIER CLIP 9.375 MED OPEN (MISCELLANEOUS)
APR CLP MED 9.3 20 MLT OPN (MISCELLANEOUS)
BINDER BREAST LRG (GAUZE/BANDAGES/DRESSINGS) ×2 IMPLANT
BINDER BREAST MEDIUM (GAUZE/BANDAGES/DRESSINGS) IMPLANT
BINDER BREAST XLRG (GAUZE/BANDAGES/DRESSINGS) IMPLANT
BINDER BREAST XXLRG (GAUZE/BANDAGES/DRESSINGS) IMPLANT
BLADE SURG 15 STRL LF DISP TIS (BLADE) ×1 IMPLANT
BLADE SURG 15 STRL SS (BLADE) ×3
CANISTER SUCT 1200ML W/VALVE (MISCELLANEOUS) ×3 IMPLANT
CHLORAPREP W/TINT 26ML (MISCELLANEOUS) ×3 IMPLANT
CLIP APPLIE 9.375 MED OPEN (MISCELLANEOUS) IMPLANT
COVER BACK TABLE 60X90IN (DRAPES) ×3 IMPLANT
COVER MAYO STAND STRL (DRAPES) ×3 IMPLANT
DECANTER SPIKE VIAL GLASS SM (MISCELLANEOUS) ×3 IMPLANT
DERMABOND ADVANCED (GAUZE/BANDAGES/DRESSINGS) ×2
DERMABOND ADVANCED .7 DNX12 (GAUZE/BANDAGES/DRESSINGS) ×1 IMPLANT
DEVICE DUBIN W/COMP PLATE 8390 (MISCELLANEOUS) IMPLANT
DRAPE LAPAROSCOPIC ABDOMINAL (DRAPES) IMPLANT
DRAPE LAPAROTOMY 100X72 PEDS (DRAPES) ×3 IMPLANT
DRAPE UTILITY XL STRL (DRAPES) ×3 IMPLANT
ELECT COATED BLADE 2.86 ST (ELECTRODE) ×3 IMPLANT
ELECT REM PT RETURN 9FT ADLT (ELECTROSURGICAL) ×3
ELECTRODE REM PT RTRN 9FT ADLT (ELECTROSURGICAL) ×1 IMPLANT
GLOVE BIO SURGEON STRL SZ 6.5 (GLOVE) ×1 IMPLANT
GLOVE BIO SURGEONS STRL SZ 6.5 (GLOVE) ×1
GLOVE BIOGEL PI IND STRL 7.0 (GLOVE) IMPLANT
GLOVE BIOGEL PI IND STRL 8 (GLOVE) ×1 IMPLANT
GLOVE BIOGEL PI INDICATOR 7.0 (GLOVE) ×2
GLOVE BIOGEL PI INDICATOR 8 (GLOVE) ×2
GLOVE ECLIPSE 8.0 STRL XLNG CF (GLOVE) ×3 IMPLANT
GOWN STRL REUS W/ TWL LRG LVL3 (GOWN DISPOSABLE) ×2 IMPLANT
GOWN STRL REUS W/TWL LRG LVL3 (GOWN DISPOSABLE) ×6
KIT MARKER MARGIN INK (KITS) IMPLANT
NDL HYPO 25X1 1.5 SAFETY (NEEDLE) ×1 IMPLANT
NEEDLE HYPO 25X1 1.5 SAFETY (NEEDLE) ×3 IMPLANT
NS IRRIG 1000ML POUR BTL (IV SOLUTION) ×3 IMPLANT
PACK BASIN DAY SURGERY FS (CUSTOM PROCEDURE TRAY) ×3 IMPLANT
PENCIL BUTTON HOLSTER BLD 10FT (ELECTRODE) ×3 IMPLANT
SLEEVE SCD COMPRESS KNEE MED (MISCELLANEOUS) ×3 IMPLANT
SPONGE LAP 4X18 RFD (DISPOSABLE) ×3 IMPLANT
STAPLER VISISTAT 35W (STAPLE) IMPLANT
SUT MON AB 4-0 PC3 18 (SUTURE) ×3 IMPLANT
SUT SILK 2 0 SH (SUTURE) IMPLANT
SUT VICRYL 3-0 CR8 SH (SUTURE) ×3 IMPLANT
SYR CONTROL 10ML LL (SYRINGE) ×3 IMPLANT
TOWEL GREEN STERILE FF (TOWEL DISPOSABLE) ×6 IMPLANT
TOWEL OR NON WOVEN STRL DISP B (DISPOSABLE) ×3 IMPLANT
TUBE CONNECTING 20'X1/4 (TUBING) ×1
TUBE CONNECTING 20X1/4 (TUBING) ×2 IMPLANT
YANKAUER SUCT BULB TIP NO VENT (SUCTIONS) ×3 IMPLANT

## 2017-12-21 NOTE — Anesthesia Preprocedure Evaluation (Signed)
Anesthesia Evaluation  Patient identified by MRN, date of birth, ID band Patient awake    Reviewed: Allergy & Precautions, NPO status , Patient's Chart, lab work & pertinent test results  Airway Mallampati: I       Dental no notable dental hx. (+) Teeth Intact   Pulmonary neg pulmonary ROS,    Pulmonary exam normal breath sounds clear to auscultation       Cardiovascular negative cardio ROS Normal cardiovascular exam Rhythm:Regular Rate:Normal     Neuro/Psych negative neurological ROS  negative psych ROS   GI/Hepatic negative GI ROS, Neg liver ROS,   Endo/Other  diabetes, Well Controlled, Oral Hypoglycemic Agents  Renal/GU negative Renal ROS  negative genitourinary   Musculoskeletal   Abdominal Normal abdominal exam  (+)   Peds  Hematology   Anesthesia Other Findings   Reproductive/Obstetrics                             Anesthesia Physical Anesthesia Plan  ASA: II  Anesthesia Plan: General   Post-op Pain Management:    Induction: Intravenous  PONV Risk Score and Plan: 4 or greater and Ondansetron, Dexamethasone and Midazolam  Airway Management Planned: LMA  Additional Equipment:   Intra-op Plan:   Post-operative Plan: Extubation in OR  Informed Consent: I have reviewed the patients History and Physical, chart, labs and discussed the procedure including the risks, benefits and alternatives for the proposed anesthesia with the patient or authorized representative who has indicated his/her understanding and acceptance.   Dental advisory given  Plan Discussed with: CRNA and Surgeon  Anesthesia Plan Comments:         Anesthesia Quick Evaluation

## 2017-12-21 NOTE — Interval H&P Note (Signed)
History and Physical Interval Note:  12/21/2017 7:05 AM  [name]  has presented today for surgery, with the diagnosis of SEBACEOUS CYST LEFT BREAST  The various methods of treatment have been discussed with the patient and family. After consideration of risks, benefits and other options for treatment, the patient has consented to  Procedure(s): EXCISION OF LEFT BREAST CYST (Left) as a surgical intervention .  The patient's history has been reviewed, patient examined, no change in status, stable for surgery.  I have reviewed the patient's chart and labs.  Questions were answered to the patient's satisfaction.     [name] A Belkys Henault

## 2017-12-21 NOTE — Discharge Instructions (Signed)
°Post Anesthesia Home Care Instructions ° °Activity: °Get plenty of rest for the remainder of the day. A responsible individual must stay with you for 24 hours following the procedure.  °For the next 24 hours, DO NOT: °-Drive a car °-Operate machinery °-Drink alcoholic beverages °-Take any medication unless instructed by your physician °-Make any legal decisions or sign important papers. ° °Meals: °Start with liquid foods such as gelatin or soup. Progress to regular foods as tolerated. Avoid greasy, spicy, heavy foods. If nausea and/or vomiting occur, drink only clear liquids until the nausea and/or vomiting subsides. Call your physician if vomiting continues. ° °Special Instructions/Symptoms: °Your throat may feel dry or sore from the anesthesia or the breathing tube placed in your throat during surgery. If this causes discomfort, gargle with warm salt water. The discomfort should disappear within 24 hours. ° °If you had a scopolamine patch placed behind your ear for the management of post- operative nausea and/or vomiting: ° °1. The medication in the patch is effective for 72 hours, after which it should be removed.  Wrap patch in a tissue and discard in the trash. Wash hands thoroughly with soap and water. °2. You may remove the patch earlier than 72 hours if you experience unpleasant side effects which may include dry mouth, dizziness or visual disturbances. °3. Avoid touching the patch. Wash your hands with soap and water after contact with the patch. °  ° ° ° ° °[facility] °Office Phone Number [phone] ° °BREAST BIOPSY/ PARTIAL MASTECTOMY: POST OP INSTRUCTIONS ° °Always review your discharge instruction sheet given to you by the facility where your surgery was performed. ° °IF YOU HAVE DISABILITY OR FAMILY LEAVE FORMS, YOU MUST BRING THEM TO THE OFFICE FOR PROCESSING.  DO NOT GIVE THEM TO YOUR DOCTOR. ° °1. A prescription for pain medication may be given to you upon discharge.  Take your  pain medication as prescribed, if needed.  If narcotic pain medicine is not needed, then you may take acetaminophen (Tylenol) or ibuprofen (Advil) as needed. °2. Take your usually prescribed medications unless otherwise directed °3. If you need a refill on your pain medication, please contact your pharmacy.  They will contact our office to request authorization.  Prescriptions will not be filled after 5pm or on week-ends. °4. You should eat very light the first 24 hours after surgery, such as soup, crackers, pudding, etc.  Resume your normal diet the day after surgery. °5. Most patients will experience some swelling and bruising in the breast.  Ice packs and a good support bra will help.  Swelling and bruising can take several days to resolve.  °6. It is common to experience some constipation if taking pain medication after surgery.  Increasing fluid intake and taking a stool softener will usually help or prevent this problem from occurring.  A mild laxative (Milk of Magnesia or Miralax) should be taken according to package directions if there are no bowel movements after 48 hours. °7. Unless discharge instructions indicate otherwise, you may remove your bandages 24-48 hours after surgery, and you may shower at that time.  You may have steri-strips (small skin tapes) in place directly over the incision.  These strips should be left on the skin for 7-10 days.  If your surgeon used skin glue on the incision, you may shower in 24 hours.  The glue will flake off over the next 2-3 weeks.  Any sutures or staples will be removed at the office during your follow-up visit. °  8. ACTIVITIES:  You may resume regular daily activities (gradually increasing) beginning the next day.  Wearing a good support bra or sports bra minimizes pain and swelling.  You may have sexual intercourse when it is comfortable. °a. You may drive when you no longer are taking prescription pain medication, you can comfortably wear a seatbelt, and you can  safely maneuver your car and apply brakes. °b. RETURN TO WORK:  ______________________________________________________________________________________ °9. You should see your doctor in the office for a follow-up appointment approximately two weeks after your surgery.  Your doctor’s nurse will typically make your follow-up appointment when she calls you with your pathology report.  Expect your pathology report 2-3 business days after your surgery.  You may call to check if you do not hear from us after three days. °10. OTHER INSTRUCTIONS: _______________________________________________________________________________________________ _____________________________________________________________________________________________________________________________________ °_____________________________________________________________________________________________________________________________________ °_____________________________________________________________________________________________________________________________________ ° °WHEN TO CALL YOUR DOCTOR: °1. Fever over 101.0 °2. Nausea and/or vomiting. °3. Extreme swelling or bruising. °4. Continued bleeding from incision. °5. Increased pain, redness, or drainage from the incision. ° °The clinic staff is available to answer your questions during regular business hours.  Please don’t hesitate to call and ask to speak to one of the nurses for clinical concerns.  If you have a medical emergency, go to the nearest emergency room or call 911.  A surgeon from [facility] is always on call at the hospital. ° °For further questions, please visit [email or web address]  °

## 2017-12-21 NOTE — Anesthesia Procedure Notes (Signed)
Procedure Name: LMA Insertion Performed by: Ramin Zoll M, CRNA Pre-anesthesia Checklist: Patient identified, Emergency Drugs available, Suction available, Patient being monitored and Timeout performed Patient Re-evaluated:Patient Re-evaluated prior to induction Oxygen Delivery Method: Circle system utilized Preoxygenation: Pre-oxygenation with 100% oxygen Induction Type: IV induction Ventilation: Mask ventilation without difficulty LMA: LMA inserted LMA Size: 3.0 Tube type: Oral Number of attempts: 1 Placement Confirmation: positive ETCO2,  CO2 detector and breath sounds checked- equal and bilateral Tube secured with: Tape Dental Injury: Teeth and Oropharynx as per pre-operative assessment        

## 2017-12-21 NOTE — Anesthesia Postprocedure Evaluation (Signed)
Anesthesia Post Note  Patient: [name]  Procedure(s) Performed: EXCISION OF LEFT BREAST CYST (Left Breast)     Patient location during evaluation: PACU Anesthesia Type: General Level of consciousness: awake Pain management: pain level controlled Vital Signs Assessment: post-procedure vital signs reviewed and stable Respiratory status: spontaneous breathing Cardiovascular status: stable Postop Assessment: no apparent nausea or vomiting Anesthetic complications: no    Last Vitals:  Vitals:   12/21/17 0830 12/21/17 0905  BP: 113/78 116/74  Pulse: 71 71  Resp: 20 18  Temp:  36.5 C  SpO2: 100% 100%    Last Pain:  Vitals:   12/21/17 0640  TempSrc: Oral  PainSc: 0-No pain   Pain Goal:                 Yumalay Circle JR,JOHN Jasean Ambrosia

## 2017-12-21 NOTE — Op Note (Signed)
Preoperative diagnosis: 3 cm x 3 cm left breast cyst  Postoperative diagnosis: Same  Procedure: Excision of left breast cyst  Surgeon: Erroll Luna, MD  Anesthesia: LMA with local  EBL: 5 cc  Specimen: Left breast cyst from medial left breast 3 cm in maximal diameter pathology  Drains: None  IV fluids: Per anesthesia record  Indications for procedure: The patient is a 42 year old female who is a long-standing history of a cyst involving her left medial breast.  Work-up revealed this to be consistent with a possible epidermal inclusion cyst.  Is been causing pain and at times draining.  It measures 3 cm and is getting larger.  She desired excision.  The procedure has been discussed with the patient. Alternatives to surgery have been discussed with the patient.  Risks of surgery include bleeding,  Infection,  Seroma formation, death,  and the need for further surgery.   The patient understands and wishes to proceed.INTERPRETER USED.    Description of procedure: The patient was met in the holding area and questions were answered.  The left breast mass was marked.  She was taken back to the operating room placed supine.  After induction of general anesthesia the left breast was prepped and draped in sterile fashion and timeout was done.  Proper patient, side and procedure were verified.  Local anesthetic was infiltrated around the cyst in the left medial breast consisting of 0.25% Sensorcaine with epinephrine.  Curvilinear incision was made in a vertical fashion along the natural contour of the medial left breast to excise a cyst which is about 3 cm in maximal diameter.  This was quite deep.  Hemostasis achieved with cautery and wound closed with 3-0 Vicryl and 4-0 Monocryl.  All final counts found to be correct.  The specimen is passed off the field.  Dermabond and breast binder applied.  The patient was awoke extubated taken to recovery in satisfactory condition.

## 2017-12-21 NOTE — H&P (Signed)
[name]  Location: [facility] Patient #: [id number] DOB: [birth year] Married / Language: English / Race: White Female  History of Present Illness Julia Fus A. Mariellen Blaney MD\  Patient words: Patient sent at the request of Dr. Linde Gillis for a cyst involving her left medial breast. It has been present for 7 years. She had one episode of redness and drainage 6 years ago. He has not been causing any symptoms recently. She was evaluated at the breast center of the cyst was examined by ultrasound. There are no other abnormalities in either breast noted. Denies any history of breast pain nipple discharge or other symptoms involving her breast. A translator was available for use.         ADDENDUM REPORT: 11/05/2017 09:09 ADDENDUM: Per patient request, surgical consultation has been rescheduled with Dr. Harriette Bouillon at [facility] on November 19, 2017 for the probably benign complicated epidermal inclusion cyst of LEFT breast. The patient has been informed of the date and time of the appointment by Durene Cal, Bilingual Patient Services Representative. The patient agrees to date and time of this appointment. Reported by Phylliss Bob, RN Electronically Signed By: Bary Richard M.D. On: 11/05/2017 09:09 Addended by Corky Sox, MD on 11/05/2017 9:32 AM ADDENDUM REPORT: 11/04/2017 15:44 ADDENDUM: Surgical consultation has been arranged with Dr. Harriette Bouillon at [facility] on 11/09/17. Patient has been informed of date and time of the appointment by Durene Cal, Bilingual Patient Services Representative. Reported by Phylliss Bob, RN Electronically Signed By: Bary Richard M.D. On: 11/04/2017 15:44 Addended by Corky Sox, MD on 11/04/2017 3:57 PM  Study Result CLINICAL DATA: Palpable lump within the inner LEFT breast for 1 month. EXAM: DIGITAL DIAGNOSTIC BILATERAL MAMMOGRAM WITH CAD AND TOMO  ULTRASOUND LEFT BREAST COMPARISON: Previous LEFT breast ultrasound dated 09/24/2010. No previous mammograms. ACR Breast Density Category c: The breast tissue is heterogeneously dense, which may obscure small masses. FINDINGS: Bilateral CC and MLO views were obtained today, with additional 3D tomosynthesis, and with additional spot compression view of the inner LEFT breast corresponding to the area of clinical concern, with overlying skin marker in place. There is an oval circumscribed low-density mass within the inner LEFT breast, at superficial depth, possibly within the skin, measuring approximately 1.6 cm, corresponding to the palpable lump. There are no additional dominant masses, suspicious calcifications or secondary signs of malignancy within either breast. Mammographic images were processed with CAD. Targeted ultrasound is performed, showing an oval circumscribed mixed echogenicity mass within the LEFT breast at the 9 o'clock axis, 12 cm from the nipple (parasternal), measuring 1.8 x 1.1 x 1.7 cm, avascular, at superficial depth just below the skin. No skin tract identified. IMPRESSION: 1. Probably benign complicated epidermal inclusion cyst just below the skin of the LEFT breast at the 9 o'clock axis, 12 cm from the nipple (parasternal), measuring 1.8 cm, corresponding to the area of clinical concern. Recommend surgical excision for definitive treatment and to ensure benignity. Ultrasound-guided core biopsy is not recommended due to the possibility of causing chemical mastitis and/or abscess. 2. No evidence of malignancy within the RIGHT breast. RECOMMENDATION: Surgical excision of the superficial mass within the LEFT breast mass, presumed complicated epidermal inclusion cyst. Patient will be scheduled for surgical consultation with [facility]. Findings discussed with [name], RN, with BCCCP at the time of today's exam. I have discussed the findings and  recommendations with the patient. Results were also provided in writing at the conclusion of the visit. If applicable,  a reminder letter will be sent to the patient regarding the next appointment. BI-RADS CATEGORY 3: Probably benign. Electronically Signed: By: Bary Richard M.D. On: 11/04/2017 11:06.  The patient is a 42 year old female.   Past Surgical History [name]; \ Cesarean Section - Multiple  Diagnostic Studies History (Angela Holmes;\ Colonoscopy never  Allergies Marylene Land Holmes\No Known Drug Allergies [11/19/2017]: Allergies Reconciled  Medication History [name]; Calcium Carbonate (600MG  Tablet, Oral) Active.  Social History [name];  11:11 AM) Caffeine use Coffee.  Family History (Angela Holmes:11 AM) Diabetes Mellitus Mother.  Pregnancy / Birth History (Angela Holmes;1:11 AM)  ge at menarche 14 years. Gravida 5 Length (months) of breastfeeding 7-12 Maternal age 38-25 Para 3 Regular periods  Other Problems [name]; 11/19/2017 11:11 AM) No pertinent past medical history     Review of Systems (Angela Holmes9 11:12 AM) General Present- Chills, Night Sweats and Weight Loss. Not Present- Appetite Loss, Fatigue, Fever and Weight Gain. Skin Present- Dryness. Not Present- Change in Wart/Mole, Hives, Jaundice, New Lesions, Non-Healing Wounds, Rash and Ulcer. HEENT Present- Earache, Ringing in the Ears and Visual Disturbances. Not Present- Hearing Loss, Hoarseness, Nose Bleed, Oral Ulcers, Seasonal Allergies, Sinus Pain, Sore Throat, Wears glasses/contact lenses and Yellow Eyes. Breast Present- Breast Mass and Nipple Discharge. Not Present- Breast Pain and Skin Changes. Cardiovascular Present- Leg Cramps. Not Present- Chest Pain, Difficulty Breathing Lying Down, Palpitations, Rapid Heart Rate, Shortness of Breath and Swelling of Extremities. Gastrointestinal Present- Bloating. Not Present- Abdominal Pain, Bloody  Stool, Change in Bowel Habits, Chronic diarrhea, Constipation, Difficulty Swallowing, Excessive gas, Gets full quickly at meals, Hemorrhoids, Indigestion, Nausea, Rectal Pain and Vomiting. Musculoskeletal Present- Back Pain, Joint Pain and Muscle Weakness. Not Present- Joint Stiffness, Muscle Pain and Swelling of Extremities. Neurological Present- Decreased Memory, Headaches, Tremor and Weakness. Not Present- Fainting, Numbness, Seizures, Tingling and Trouble walking. Psychiatric Present- Anxiety. Not Present- Bipolar, Change in Sleep Pattern, Depression, Fearful and Frequent crying. Endocrine Present- Cold Intolerance, Hot flashes and New Diabetes. Not Present- Excessive Hunger, Hair Changes and Heat Intolerance. Hematology Present- Easy Bruising. Not Present- Blood Thinners, Excessive bleeding, Gland problems, HIV and Persistent Infections.  Vitals [name]; AM Weight: 151 lb Height: 62in Body Surface Area: 1.7 m Body Mass Index: 27.62 kg/m  Temp.: 98.84F(Oral)  Pulse: 89 (Regular)  BP: 110/72 (Sitting, Left Arm, Standard)      Physical Exam (Sareena Odeh A. Aerik Polan MD;  General Mental Status-Alert. General Appearance-Consistent with stated age. Hydration-Well hydrated. Voice-Normal.  Head and Neck Head-normocephalic, atraumatic with no lesions or palpable masses. Trachea-midline. Thyroid Gland Characteristics - normal size and consistency.  Breast Note: Left medial breast is a 2 cm x 1 cm epidermal inclusion cyst which is not inflamed. There are no other breast masses.  Neurologic Neurologic evaluation reveals -alert and oriented x 3 with no impairment of recent or remote memory. Mental Status-Normal.  Musculoskeletal Normal Exam - Left-Upper Extremity Strength Normal and Lower Extremity Strength Normal. Normal Exam - Right-Upper Extremity Strength Normal and Lower Extremity Strength Normal.    Assessment & Plan (Wonda Goodgame  A. Iyanni Hepp MD; EPIDERMAL INCLUSION CYST (L72.0) Impression: left breast pt desires excision risks and benefits discussed with translator on hand she agrees to proceed  Current Plans The pathophysiology of skin & subcutaneous masses was discussed. Natural history risks without surgery were discussed. I recommended surgery to remove the mass. I explained the technique of removal with use of local anesthesia & possible need for more aggressive sedation/anesthesia for patient comfort.  Risks such as bleeding, infection, wound breakdown, heart attack, death, and other risks were discussed. I noted a good likelihood this will help address the problem. Possibility that this will not correct all symptoms was explained. Possibility of regrowth/recurrence of the mass was discussed. We will work to minimize complications. Questions were answered. The patient expresses understanding & wishes to proceed with surgery.  Pt Education - CCS Free Text Education/Instructions: discussed with patient and provided information. Pt Education - CCS General Post-op HCI

## 2017-12-21 NOTE — Transfer of Care (Signed)
Immediate Anesthesia Transfer of Care Note  Patient: [name]  Procedure(s) Performed: EXCISION OF LEFT BREAST CYST (Left Breast)  Patient Location: PACU  Anesthesia Type:General  Level of Consciousness: awake, alert  and oriented  Airway & Oxygen Therapy: Patient Spontanous Breathing and Patient connected to face mask oxygen  Post-op Assessment: Report given to RN and Post -op Vital signs reviewed and stable  Post vital signs: Reviewed and stable  Last Vitals:  Vitals Value Taken Time  BP    Temp    Pulse    Resp    SpO2      Last Pain:  Vitals:   12/21/17 0640  TempSrc: Oral  PainSc: 0-No pain         Complications: No apparent anesthesia complications

## 2017-12-22 ENCOUNTER — Encounter (HOSPITAL_BASED_OUTPATIENT_CLINIC_OR_DEPARTMENT_OTHER): Payer: Self-pay | Admitting: Surgery

## 2018-01-18 ENCOUNTER — Ambulatory Visit: Payer: Self-pay | Attending: Family Medicine

## 2018-01-21 ENCOUNTER — Other Ambulatory Visit: Payer: Self-pay

## 2018-01-21 ENCOUNTER — Encounter (INDEPENDENT_AMBULATORY_CARE_PROVIDER_SITE_OTHER): Payer: Self-pay | Admitting: Physician Assistant

## 2018-01-21 ENCOUNTER — Ambulatory Visit (INDEPENDENT_AMBULATORY_CARE_PROVIDER_SITE_OTHER): Payer: Self-pay | Admitting: Physician Assistant

## 2018-01-21 VITALS — BP 105/64 | HR 63 | Temp 97.9°F | Ht 63.0 in | Wt 152.8 lb

## 2018-01-21 DIAGNOSIS — E7841 Elevated Lipoprotein(a): Secondary | ICD-10-CM

## 2018-01-21 DIAGNOSIS — Z114 Encounter for screening for human immunodeficiency virus [HIV]: Secondary | ICD-10-CM

## 2018-01-21 DIAGNOSIS — E119 Type 2 diabetes mellitus without complications: Secondary | ICD-10-CM

## 2018-01-21 DIAGNOSIS — Z23 Encounter for immunization: Secondary | ICD-10-CM

## 2018-01-21 DIAGNOSIS — L989 Disorder of the skin and subcutaneous tissue, unspecified: Secondary | ICD-10-CM

## 2018-01-21 MED ORDER — METFORMIN HCL 1000 MG PO TABS: 1000.0000 mg | ORAL_TABLET | Freq: Every day | ORAL | 3 refills | Status: DC

## 2018-01-21 MED ORDER — ATORVASTATIN CALCIUM 20 MG PO TABS: 20.0000 mg | ORAL_TABLET | Freq: Every day | ORAL | 1 refills | Status: DC

## 2018-01-21 MED ORDER — KETOCONAZOLE 2 % EX CREA: 1.0000 "application " | TOPICAL_CREAM | Freq: Every day | CUTANEOUS | 0 refills | Status: DC

## 2018-01-21 NOTE — Patient Instructions (Signed)
Prevencin del colesterol alto Preventing High Cholesterol El colesterol es una sustancia cerosa parecida a la grasa que el organismo necesita en pequeas cantidades. El hgado fabrica todo el colesterol que el cuerpo necesita. Tener el colesterol alto (hipercolesterolemia) aumenta el riesgo de sufrir enfermedades cardacas y accidentes cerebrovasculares. El colesterol extra (exceso de colesterol) proviene de los alimentos que come, por ejemplo grasas de origen animal (grasas saturadas) de la carne y de algunos productos lcteos. El colesterol alto con frecuencia puede prevenirse con cambios en la dieta y en el estilo de vida. Si ya tiene Orthoptist, puede controlarlo haciendo cambios en la dieta y en el estilo de vida, adems de con medicamentos. Qu cambios en la alimentacin se pueden hacer?  Coma menos grasas saturadas. Los alimentos que contienen grasas saturadas incluyen las carnes rojas y algunos productos lcteos.  Evite las carnes procesadas, como el tocino, los fiambres y embutidos.  Evite las grasas trans, que se encuentran en la margarina y en algunos productos horneados.  Evite alimentos y bebidas que tengan azcares agregados.  Consuma ms frutas, verduras y cereales integrales.  Elija fuentes saludables de protenas, como el pescado, la carne de ave y los frutos secos.  Elija fuentes saludables de grasas, por ejemplo: ? Frutos secos. ? Aceites vegetales, en particular el aceite de oliva. ? Pescados que contengan grasas saludables (cidos grasos omega-3), como la caballa o el salmn. Qu cambios en el estilo de vida se pueden realizar?  Baje de peso si es necesario. Bajar entre 5 y 10lb (2,3 a 4,5kg) puede ayudar a prevenir o Academic librarian alto y a Psychiatrist de padecer diabetes y presin arterial alta (hipertensin). Pdale al mdico que le recomiende una dieta y un plan de ejercicios para bajar de peso de forma segura.  Ejerctese lo suficiente.  Debe realizar al menos 168minutos de ejercicios de intensidad moderada todas las semanas. ? Patent examiner en sesiones cortas de ejercicios, varias veces al da, o puede realizar sesiones ms largas, pero menos veces por semana. Por ejemplo, puede realizar una caminata enrgica o andar en bicicleta durante 47minutos, 3veces al da, durante 5das a la semana.  No fume. Si necesita ayuda para dejar de fumar, consulte al mdico.  Limite el consumo de bebidas alcohlicas. Si bebe alcohol, limite el consumo a no ms de 53medida por da si es mujer y no est [place], y 68medidas por da si es hombre. Una medida equivale a 12onzas de cerveza, 5onzas de vino o 1onzas de bebidas alcohlicas de alta graduacin. Por qu son importantes estos cambios? Si tiene Orthoptist, se pueden acumular depsitos de esta sustancia (placa) en las paredes de los vasos sanguneos. La placa hace que las arterias se vuelvan ms estrechas y rgidas, lo que puede limitar u obstruir la circulacin sangunea y Actor la formacin de cogulos de [place]. Esto aumenta en gran medida el riesgo de infarto de miocardio y de accidente cerebrovascular. Hacer cambios en la dieta y en el estilo de vida puede ayudar a reducir el riesgo de sufrir estas afecciones potencialmente mortales. Qu puedo hacer para reducir mis riesgos?  Controle los factores de riesgo del colesterol alto. Hable con el mdico acerca de todos los factores de riesgo y cmo reducir Catering manager.  Controle otras afecciones que pueda tener, por ejemplo diabetes o presin arterial alta (hipertensin).  Contrlese el colesterol a intervalos regulares.  Concurra a todas las visitas de control como se lo haya indicado el mdico. Esto es  importante. Cmo se trata? Adems de los cambios en la dieta y en el estilo de vida, el mdico puede recomendarle que tome ciertos medicamentos para reducir el colesterol, por ejemplo, medicamentos que reducen la  cantidad de colesterol producida por el hgado. Es posible que necesite tomar medicamentos si:  No logra reducir lo suficiente el colesterol con cambios en la dieta y en el estilo de vida.  Tiene colesterol alto y presenta otros factores de riesgo de sufrir enfermedades cardacas o accidentes cerebrovasculares.  Tome los medicamentos de venta libre y los recetados solamente como se lo haya indicado el mdico. Dnde encontrar ms informacin:  Asociacin Estadounidense de FirefighterCardiologa (American Heart Association, Psychologist, educationalAHA): 1122334455www.heart.org/HEARTORG/Conditions/Cholesterol/Cholesterol_UCM_001089_SubHomePage.jsp  Training and development officernstituto Nacional de FirefighterCardiologa, Corporate investment bankereumoloLandscape architectga y Teacher, English as a foreign languageHematologa (Armed forces training and education officerational Heart, Lung, and Commercial Metals CompanyBlood Institute, NHLBI): http://hood.com/www.nhlbi.nih.gov/health/resources/heart/heart-cholesterol-hbc-what-html Resumen  El colesterol alto aumenta el riesgo de sufrir enfermedades cardacas y accidentes cerebrovasculares. Si mantiene el colesterol bajo, puede reducir el riesgo de tener estas afecciones.  Los Allied Waste Industriescambios en la dieta y en el estilo de vida son los pasos ms importantes para prevenir Nurse, learning disabilityel colesterol alto.  Consulte al mdico para controlar los factores de riesgo y hgase anlisis de sangre con regularidad. Esta informacin no tiene Theme park managercomo fin reemplazar el consejo del mdico. Asegrese de hacerle al mdico cualquier pregunta que tenga. Document Released: 06/09/2015 Document Revised: 09/02/2016 Document Reviewed: 06/09/2015 Elsevier Interactive Patient Education  [employer]2018 Elsevier Inc.

## 2018-01-21 NOTE — Progress Notes (Signed)
Subjective:  Patient ID: [name], female    DOB: [birth year]  Age: 42 y.o. MRN: [id number]  CC: f/u DM  HPI  Julia Dominguez-Urietais a 42 y.o.femalewith amedical historyof BTL presents to f/u on DM2. Last A1c 5.6% three months ago. Taking Metformin as directed. Does not endorse fatigue, visual blurring, tingling, numbness, polydipsia, polyuria, weight loss, or vaginal itching. Only complaint is of three month history of small pruritic lesions at the corner of eye and mouth bilaterally. Had the same lesions many years ago and says the lesions took two years to resolve. Does not endorse any other symptoms or complaints.   Outpatient Medications Prior to Visit  Medication Sig Dispense Refill  . glucose blood test strip USE AS INSTRUCTED UP TO FOUR TIME PER DAY  12  . metFORMIN (GLUCOPHAGE) 1000 MG tablet   5  . Calcium Carbonate-Vitamin D (CALCIUM 500 + D PO) Take 1 tablet by mouth daily.    . ferrous sulfate 325 (65 FE) MG EC tablet Take 1 tablet (325 mg total) by mouth 3 (three) times daily with meals. (Patient not taking: Reported on 01/21/2018) 90 tablet 0  . ibuprofen (ADVIL,MOTRIN) 800 MG tablet Take 1 tablet (800 mg total) by mouth every 8 (eight) hours as needed. (Patient not taking: Reported on 01/21/2018) 30 tablet 0   No facility-administered medications prior to visit.      ROS Review of Systems  Constitutional: Negative for chills, fever and malaise/fatigue.  Eyes: Negative for blurred vision.  Respiratory: Negative for shortness of breath.   Cardiovascular: Negative for chest pain and palpitations.  Gastrointestinal: Negative for abdominal pain and nausea.  Genitourinary: Negative for dysuria and hematuria.  Musculoskeletal: Negative for joint pain and myalgias.  Skin: Negative for rash.  Neurological: Negative for tingling and headaches.  Psychiatric/Behavioral: Negative for depression. The patient is not nervous/anxious.     Objective:  BP  105/64 (BP Location: Left Arm, Patient Position: Sitting, Cuff Size: Normal)   Pulse 63   Temp 97.9 F (36.6 C) (Oral)   Ht 5\' 3"  (1.6 m)   Wt 152 lb 12.8 oz (69.3 kg)   LMP 01/04/2018 (Exact Date)   SpO2 98%   BMI 27.07 kg/m   BP/Weight 01/21/2018 12/21/2017 11/04/2017  Systolic BP 105 116 108  Diastolic BP 64 74 64  Wt. (Lbs) 152.8 154 -  BMI 27.07 27.28 26.22      Physical Exam  Constitutional: She is oriented to person, place, and time.  Well developed, well nourished, NAD, polite  HENT:  Head: Normocephalic and atraumatic.  Eyes: No scleral icterus.  Neck: Normal range of motion. Neck supple. No thyromegaly present.  Cardiovascular: Normal rate, regular rhythm and normal heart sounds.  Pulmonary/Chest: Effort normal and breath sounds normal.  Abdominal: Soft. Bowel sounds are normal. There is no tenderness.  Musculoskeletal: She exhibits no edema.  Neurological: She is alert and oriented to person, place, and time.  Skin: Skin is warm and dry. No rash noted. No erythema. No pallor.  Small patches of erythematous lesions with scale at the corners of the eye and mouth bilaterally  Psychiatric: She has a normal mood and affect. Her behavior is normal. Thought content normal.  Vitals reviewed.    Assessment & Plan:     1. Type 2 diabetes mellitus without complication, without long-term current use of insulin (HCC) - metFORMIN (GLUCOPHAGE) 1000 MG tablet; Take 1 tablet (1,000 mg total) by mouth daily with breakfast.  Dispense: 90 tablet; Refill:  3  2. Need for prophylactic vaccination against Streptococcus pneumoniae (pneumococcus) - Pneumococcal polysaccharide vaccine 23-valent greater than or equal to 2yo subcutaneous/IM  3. Need for Tdap vaccination - Tdap vaccine greater than or equal to 7yo IM  4. Screening for HIV (human immunodeficiency virus) - HIV antibody  5. Elevated lipoprotein(a) - atorvastatin (LIPITOR) 20 MG tablet; Take 1 tablet (20 mg total) by  mouth daily.  Dispense: 90 tablet; Refill: 1  6. Skin lesion - ketoconazole (NIZORAL) 2 % cream; Apply 1 application topically daily.  Dispense: 15 g; Refill: 0   Meds ordered this encounter  Medications  . atorvastatin (LIPITOR) 20 MG tablet    Sig: Take 1 tablet (20 mg total) by mouth daily.    Dispense:  90 tablet    Refill:  1    Order Specific Question:   Supervising Provider    Answer:   Hoy RegisterNEWLIN, ENOBONG [[id number]]  . metFORMIN (GLUCOPHAGE) 1000 MG tablet    Sig: Take 1 tablet (1,000 mg total) by mouth daily with breakfast.    Dispense:  90 tablet    Refill:  3    Order Specific Question:   Supervising Provider    Answer:   Hoy RegisterNEWLIN, ENOBONG [[id number]]  . ketoconazole (NIZORAL) 2 % cream    Sig: Apply 1 application topically daily.    Dispense:  15 g    Refill:  0    Order Specific Question:   Supervising Provider    Answer:   Hoy RegisterNEWLIN, ENOBONG [[id number]]    Follow-up: Return in about 6 months (around 07/24/2018) for diabetes and cholesterol.   Loletta Specteroger David Christyn Gutkowski PA

## 2018-01-22 LAB — HIV ANTIBODY (ROUTINE TESTING W REFLEX): HIV Screen 4th Generation wRfx: NONREACTIVE

## 2018-01-24 ENCOUNTER — Telehealth (INDEPENDENT_AMBULATORY_CARE_PROVIDER_SITE_OTHER): Payer: Self-pay

## 2018-01-24 NOTE — Telephone Encounter (Signed)
Patient was called and informed of lab results via interpreter. 

## 2018-01-24 NOTE — Telephone Encounter (Signed)
-----   Message from Loletta Specteroger David Gomez, PA-C sent at 01/24/2018  9:04 AM EDT ----- HIV negative.

## 2018-02-11 MED FILL — KETOCONAZOLE 2% CREAM: 2 | 10 days supply | Qty: 15 | Fill #0

## 2018-02-11 MED FILL — ?ATORVASTATIN 20 MG TABLET: 20 | 30 days supply | Qty: 30 | Fill #0

## 2018-02-11 MED FILL — GLIMEPIRIDE 2 MG TABS: 2 | 30 days supply | Qty: 30 | Fill #0

## 2018-02-11 MED FILL — ?METFORMIN HCL 1000MG TABS: 1000 | 30 days supply | Qty: 30 | Fill #0

## 2018-04-01 MED FILL — ?ATORVASTATIN 20 MG TABLET: 20 | 30 days supply | Qty: 30 | Fill #1

## 2018-04-25 ENCOUNTER — Ambulatory Visit (INDEPENDENT_AMBULATORY_CARE_PROVIDER_SITE_OTHER): Payer: Self-pay | Admitting: Physician Assistant

## 2018-04-25 ENCOUNTER — Encounter (INDEPENDENT_AMBULATORY_CARE_PROVIDER_SITE_OTHER): Payer: Self-pay | Admitting: Physician Assistant

## 2018-04-25 VITALS — BP 105/69 | HR 76 | Temp 98.1°F | Ht 63.0 in | Wt 164.2 lb

## 2018-04-25 DIAGNOSIS — E119 Type 2 diabetes mellitus without complications: Secondary | ICD-10-CM

## 2018-04-25 DIAGNOSIS — L309 Dermatitis, unspecified: Secondary | ICD-10-CM

## 2018-04-25 LAB — POCT GLYCOSYLATED HEMOGLOBIN (HGB A1C): HEMOGLOBIN A1C: 7.9 % — AB (ref 4.0–5.6)

## 2018-04-25 MED ORDER — PREDNISONE 10 MG PO TABS: 10.0000 mg | ORAL_TABLET | Freq: Every day | ORAL | 0 refills | Status: AC

## 2018-04-25 MED ORDER — FLUCONAZOLE 150 MG PO TABS: 150.0000 mg | ORAL_TABLET | ORAL | 0 refills | Status: DC

## 2018-04-25 MED ORDER — GLIMEPIRIDE 2 MG PO TABS: 2.0000 mg | ORAL_TABLET | Freq: Every day | ORAL | 3 refills | Status: DC

## 2018-04-25 MED ORDER — AMOXICILLIN-POT CLAVULANATE 875-125 MG PO TABS: 1.0000 | ORAL_TABLET | Freq: Two times a day (BID) | ORAL | 0 refills | Status: AC

## 2018-04-25 MED FILL — FLUCONAZOLE 150 MG TABS: 150 | 14 days supply | Qty: 2 | Fill #0

## 2018-04-25 MED FILL — AMOX-CLAV 875-125 MG TABLET: 875-125 | 10 days supply | Qty: 20 | Fill #0

## 2018-04-25 NOTE — Progress Notes (Addendum)
Subjective:  Patient ID: [name], female    DOB: 07/27/1975  Age: 42 y.o. MRN: [id number]  CC: dry eyes and itching  HPI  Mai Dominguez-Urietais a 42 y.o.femalewith amedical historyof BTL presents to f/u on DM2 and skin lesions. Prescribed ketoconazole for small patches of erythematous lesions with scale at the corners of the eye and mouth bilaterally. Pt has applied cream without relief of lesions. However, she does not complain of lesions of the corners of mouth any longer.     Last A1c 5.6% six months ago. Has been taking Metformin 1000 mg as directed. Says A1c may be higher due to not exercising regularly since the beginning of the cold weather. Tries to keep a lower carb diet. A1c 7.9% today.    Outpatient Medications Prior to Visit  Medication Sig Dispense Refill  . atorvastatin (LIPITOR) 20 MG tablet Take 1 tablet (20 mg total) by mouth daily. 90 tablet 1  . Calcium Carbonate-Vitamin D (CALCIUM 500 + D PO) Take 1 tablet by mouth daily.    Marland Kitchen. glucose blood test strip USE AS INSTRUCTED UP TO FOUR TIME PER DAY  12  . metFORMIN (GLUCOPHAGE) 1000 MG tablet Take 1 tablet (1,000 mg total) by mouth daily with breakfast. (Patient not taking: Reported on 04/25/2018) 90 tablet 3  . ketoconazole (NIZORAL) 2 % cream Apply 1 application topically daily. 15 g 0   No facility-administered medications prior to visit.      ROS Review of Systems  Constitutional: Negative for chills, fever and malaise/fatigue.  Eyes: Negative for blurred vision.  Respiratory: Negative for shortness of breath.   Cardiovascular: Negative for chest pain and palpitations.  Gastrointestinal: Negative for abdominal pain and nausea.  Genitourinary: Negative for dysuria and hematuria.  Musculoskeletal: Negative for joint pain and myalgias.  Skin: Negative for rash.       Dryness and itching of the lower eyelids  Neurological: Negative for tingling and headaches.  Psychiatric/Behavioral:  Negative for depression. The patient is not nervous/anxious.     Objective:  BP 105/69 (BP Location: Left Arm, Patient Position: Sitting, Cuff Size: Normal)   Pulse 76   Temp 98.1 F (36.7 C) (Oral)   Ht 5\' 3"  (1.6 m)   Wt 164 lb 3.2 oz (74.5 kg)   LMP 04/12/2018 (Exact Date)   SpO2 98%   BMI 29.09 kg/m   BP/Weight 04/25/2018 01/21/2018 12/21/2017  Systolic BP 105 105 116  Diastolic BP 69 64 74  Wt. (Lbs) 164.2 152.8 154  BMI 29.09 27.07 27.28      Physical Exam  Constitutional: She is oriented to person, place, and time.  Well developed, well nourished, NAD, polite  HENT:  Head: Normocephalic and atraumatic.  Eyes: No scleral icterus.  Neck: Normal range of motion. Neck supple. No thyromegaly present.  Cardiovascular: Normal rate, regular rhythm and normal heart sounds.  Pulmonary/Chest: Effort normal and breath sounds normal.  Musculoskeletal: She exhibits no edema.  Neurological: She is alert and oriented to person, place, and time.  Skin:  L > R periorbital dryness with mild scaling; skin somewhat firm without erythema  Psychiatric: She has a normal mood and affect. Her behavior is normal. Thought content normal.  Vitals reviewed.    Assessment & Plan:   1. Type 2 diabetes mellitus without complication, without long-term current use of insulin (HCC) - HgB A1c 7.9% - Begin glimepiride (AMARYL) 2 MG tablet; Take 1 tablet (2 mg total) by mouth daily before breakfast.  Dispense: 30  tablet; Refill: 3 - Continue Metformin 1000 mg  2. Periorbital dermatitis - Begin predniSONE (DELTASONE) 10 MG tablet; Take 1 tablet (10 mg total) by mouth daily with breakfast for 12 days. Day 1 take 6 tablets  Day 2 take 6 tablets Day 3 take 5 tablets  Day 4 take 5 tablets   Day 5 take 4 tablets  Day 6 take 4 tablets  Day 7 take 3 tablets   Day 8 take 3 tablets  Day 9 take 2 tablets Day 10 take 2 tablets  Day 11 take 1 tablet   Day 12 take 1 tablet  Dispense: 42 tablet; Refill: 0 -  Begin amoxicillin-clavulanate (AUGMENTIN) 875-125 MG tablet; Take 1 tablet by mouth 2 (two) times daily for 10 days.  Dispense: 20 tablet; Refill: 0 - Begin fluconazole (DIFLUCAN) 150 MG tablet; Take 1 tablet (150 mg total) by mouth once a week.  Dispense: 2 tablet; Refill: 0  * I told patient to call in two weeks. I will send to dermatology if no better with above treatment.  Meds ordered this encounter  Medications  . glimepiride (AMARYL) 2 MG tablet    Sig: Take 1 tablet (2 mg total) by mouth daily before breakfast.    Dispense:  30 tablet    Refill:  3    Order Specific Question:   Supervising Provider    Answer:   Hoy Register [[id number]]  . predniSONE (DELTASONE) 10 MG tablet    Sig: Take 1 tablet (10 mg total) by mouth daily with breakfast for 12 days. Day 1 take 6 tablets  Day 2 take 6 tablets Day 3 take 5 tablets  Day 4 take 5 tablets   Day 5 take 4 tablets  Day 6 take 4 tablets  Day 7 take 3 tablets   Day 8 take 3 tablets  Day 9 take 2 tablets Day 10 take 2 tablets  Day 11 take 1 tablet   Day 12 take 1 tablet    Dispense:  42 tablet    Refill:  0    Order Specific Question:   Supervising Provider    Answer:   Hoy Register [[id number]]  . amoxicillin-clavulanate (AUGMENTIN) 875-125 MG tablet    Sig: Take 1 tablet by mouth 2 (two) times daily for 10 days.    Dispense:  20 tablet    Refill:  0    Order Specific Question:   Supervising Provider    Answer:   Hoy Register [[id number]]  . fluconazole (DIFLUCAN) 150 MG tablet    Sig: Take 1 tablet (150 mg total) by mouth once a week.    Dispense:  2 tablet    Refill:  0    Order Specific Question:   Supervising Provider    Answer:   Hoy Register [[id number]]    Follow-up: Return in about 3 months (around 07/26/2018) for DM. Already has appt for 07/25/18.   Loletta Specter PA

## 2018-04-25 NOTE — Patient Instructions (Signed)
Diabetes mellitus y nutrición  Diabetes Mellitus and Nutrition  Si sufre de diabetes (diabetes mellitus), es muy importante tener hábitos alimenticios saludables debido a que sus niveles de azúcar en la sangre (glucosa) se ven afectados en gran medida por lo que come y bebe. Comer alimentos saludables en las cantidades adecuadas, aproximadamente a la misma hora todos los días, lo ayudará a:  · Controlar la glucemia.  · Disminuir el riesgo de sufrir una enfermedad cardíaca.  · Mejorar la presión arterial.  · Alcanzar o mantener un peso saludable.    Todas las personas que sufren de diabetes son diferentes y cada una tiene necesidades diferentes en cuanto a un plan de alimentación. El médico puede recomendarle que trabaje con un especialista en dietas y nutrición (nutricionista) para elaborar el mejor plan para usted. Su plan de alimentación puede variar según factores como:  · Las calorías que necesita.  · Los medicamentos que toma.  · Su peso.  · Sus niveles de glucemia, presión arterial y colesterol.  · Su nivel de actividad.  · Otras afecciones que tenga, como enfermedades cardíacas o renales.    ¿Cómo me afectan los carbohidratos?  Los carbohidratos afectan el nivel de glucemia más que cualquier otro tipo de alimento. La ingesta de carbohidratos naturalmente aumenta la cantidad glucosa en la sangre. El recuento de carbohidratos es un método destinado a llevar un registro de la cantidad de carbohidratos que se ingieren. El recuento de carbohidratos es importante para mantener la glucemia a un nivel saludable, en especial si utiliza insulina o toma determinados medicamentos por vía oral para la diabetes.  Es importante saber la cantidad de carbohidratos que se pueden ingerir en cada comida sin correr ningún riesgo. Esto es diferente en cada persona. El nutricionista puede ayudarlo a calcular la cantidad de carbohidratos que debe ingerir en cada comida y colación.   Los alimentos que contienen carbohidratos incluyen:  · Pan, cereal, arroz, pasta y galletas.  · Papas y maíz.  · Guisantes, frijoles y lentejas.  · Leche y yogur.  · Frutas y jugo.  · Postres, como pasteles, galletitas, helado y caramelos.    ¿Cómo me afecta el alcohol?  El alcohol puede provocar disminuciones súbitas de la glucemia (hipoglucemia), en especial si utiliza insulina o toma determinados medicamentos por vía oral para la diabetes. La hipoglucemia es una afección potencialmente mortal. Los síntomas de la hipoglucemia (somnolencia, mareos y confusión) son similares a los síntomas de haber consumido demasiado alcohol.  Si el médico afirma que el alcohol es seguro para usted, siga estas pautas:  · Limite el consumo de alcohol a no más de 1 medida por día si es mujer y no está embarazada, y a 2 medidas si es hombre. Una medida equivale a 12 oz (355 ml) de cerveza, 5 oz (148 ml) de vino o 1½ oz (44 ml) de bebidas de alta graduación alcohólica.  · No beba con el estómago vacío.  · Manténgase hidratado con agua, gaseosas dietéticas o té helado sin azúcar.  · Tenga en cuenta que las gaseosas comunes, los jugos y otros refrescos pueden contener mucha azúcar y se deben contar como carbohidratos.    Consejos para seguir este plan  Leer las etiquetas de los alimentos  · Comience por controlar el tamaño de la porción en la etiqueta. La cantidad de calorías, carbohidratos, grasas y otros nutrientes mencionados en la etiqueta se basan en una porción del alimento. Muchos alimentos contienen más de una porción por envase.  · Verifique la cantidad total de gramos (g)   de carbohidratos totales en una porción. Puede calcular la cantidad de porciones de carbohidratos al dividir el total de carbohidratos por 15. Por ejemplo, si un alimento posee un total de 30 g de carbohidratos, equivale a 2 porciones de carbohidratos.  · Verifique la cantidad de gramos (g) de grasas saturadas y grasas trans  en una porción. Escoja alimentos que no contengan grasa o que tengan un bajo contenido.  · Controle la cantidad de miligramos (mg) de sodio en una porción. La mayoría de las personas deben limitar la ingesta de sodio total a menos de 2300 mg por día.  · Siempre consulte la información nutricional de los alimentos etiquetados como “con bajo contenido de grasa” o “sin grasa”. Estos alimentos pueden ser más altos en azúcar agregada o en carbohidratos refinados y deben evitarse.  · Hable con el nutricionista para identificar sus objetivos diarios en cuanto a los nutrientes mencionados en la etiqueta.  De compras  · Evite comprar alimentos procesados, enlatados o prehechos. Estos alimentos tienden a tener mayor cantidad de grasa, sodio y azúcar agregada.  · Compre en la zona exterior de la tienda de comestibles. Esta incluye frutas y vegetales frescos, granos a granel, carnes frescas y productos lácteos frescos.  Cocción  · Utilice métodos de cocción a baja temperatura, como hornear, en lugar de métodos de cocción a alta temperatura, como freír en abundante aceite.  · Cocine con aceites saludables, como el aceite de oliva, canola o girasol.  · Evite cocinar con manteca, crema o carnes con alto contenido de grasa.  Planificación de las comidas  · Consuma las comidas y las colaciones de forma regular, preferentemente a la misma hora todos los días. Evite pasar largos períodos de tiempo sin comer.  · Consuma alimentos ricos en fibra, como frutas frescas, verduras, frijoles y cereales integrales. Consulte al nutricionista sobre cuántas porciones de carbohidratos puede consumir en cada comida.  · Consuma entre 4 y 6 onzas de proteínas magras por día, como carnes magras, pollo, pescado, huevos o tofu. 1 onza equivale a 1 onza de carne, pollo o pescado, 1 huevo, o 1/4 taza de tofu.  · Coma algunos alimentos por día que contengan grasas saludables, como aguacates, frutos secos, semillas y pescado.  Estilo de vida     · Controle su nivel de glucemia con regularidad.  · Haga ejercicio al menos 30 minutos, 5 días o más por semana, o como se lo haya indicado el médico.  · Tome los medicamentos como se lo haya indicado el médico.  · No consuma ningún producto que contenga nicotina o tabaco, como cigarrillos y cigarrillos electrónicos. Si necesita ayuda para dejar de fumar, consulte al médico.  · Trabaje con un asesor o instructor en diabetes para identificar estrategias para controlar el estrés y cualquier desafío emocional y social.  ¿Cuáles son algunas de las preguntas que puedo hacerle a mi médico?  · ¿Es necesario que me reúna con un instructor en diabetes?  · ¿Es necesario que me reúna con un nutricionista?  · ¿A qué número puedo llamar si tengo preguntas?  · ¿Cuáles son los mejores momentos para controlar la glucemia?  Dónde encontrar más información:  · Asociación Americana de la Diabetes (American Diabetes Association): diabetes.org/food-and-fitness/food  · Academia de Nutrición y Dietética (Academy of Nutrition and Dietetics): www.eatright.org/resources/health/diseases-and-conditions/diabetes  · Instituto Nacional de la Diabetes y las Enfermedades Digestivas y Renales (National Institute of Diabetes and Digestive and Kidney Diseases) (Institutos Nacionales de Salud, NIH): www.niddk.nih.gov/health-information/diabetes/overview/diet-eating-physical-activity  Resumen  · Un plan de alimentación saludable   lo ayudará a controlar la glucemia y mantener un estilo de vida saludable.  · Trabajar con un especialista en dietas y nutrición (nutricionista) puede ayudarlo a elaborar el mejor plan de alimentación para usted.  · Tenga en cuenta que los carbohidratos y el alcohol tienen efectos inmediatos en sus niveles de glucemia. Es importante contar los carbohidratos y consumir alcohol con prudencia.  Esta información no tiene como fin reemplazar el consejo del médico. Asegúrese de hacerle al médico cualquier pregunta que tenga.   Document Released: 09/01/2007 Document Revised: 09/14/2016 Document Reviewed: 09/14/2016  Elsevier Interactive Patient Education © 2018 Elsevier Inc.

## 2018-04-26 MED FILL — predniSONE 10 MG TABS: 10 | 12 days supply | Qty: 42 | Fill #0

## 2018-04-26 MED FILL — GLIMEPIRIDE 2 MG TABS: 2 | 30 days supply | Qty: 30 | Fill #0

## 2018-05-18 ENCOUNTER — Other Ambulatory Visit: Payer: Self-pay

## 2018-05-18 ENCOUNTER — Encounter (INDEPENDENT_AMBULATORY_CARE_PROVIDER_SITE_OTHER): Payer: Self-pay | Admitting: Physician Assistant

## 2018-05-18 ENCOUNTER — Ambulatory Visit (INDEPENDENT_AMBULATORY_CARE_PROVIDER_SITE_OTHER): Payer: Self-pay | Admitting: Physician Assistant

## 2018-05-18 VITALS — BP 108/72 | HR 78 | Temp 97.9°F | Ht 63.0 in | Wt 160.8 lb

## 2018-05-18 DIAGNOSIS — K029 Dental caries, unspecified: Secondary | ICD-10-CM

## 2018-05-18 NOTE — Patient Instructions (Signed)
Caries dentales (Dental Caries) Caries dentales (enfermedad en los dientes) Esta enfermedad puede originar un hueco en los dientes (carie) que puede volverse ms grande y profunda a lo largo del tiempo. CUIDADOS EN EL HOGAR  Cepille sus dientes y use hilo dental. Hgalo por lo menos dos veces al da.  Use dentfrico con flor.  Use enjuague bucal si as se lo indica el dentista o el mdico.  Coma menos alimentos con azcar y almidn. Beba menos bebidas azucaradas.  Evite comer con frecuencia bocadillos con azcar y almidn. Evite beber con frecuencia bebidas azucaradas.  Concurra a los controles y limpiezas regulares con el dentista.  Use suplementos con flor, si as se lo indica el dentista o el mdico.  Permita que le coloquen flor en los dientes si as se lo indica el dentista o el mdico.  Esta informacin no tiene como fin reemplazar el consejo del mdico. Asegrese de hacerle al mdico cualquier pregunta que tenga. Document Released: 03/15/2013 Document Revised: 06/15/2014 Document Reviewed: 05/27/2012 Elsevier Interactive Patient Education  2017 Elsevier Inc.  

## 2018-05-18 NOTE — Progress Notes (Signed)
Subjective:  Patient ID: [name], female    DOB: [birth year]  Age: 42 y.o. MRN: [id number]  CC: referral to dentist.  HPI Julia Dominguez-Urietais a 42 y.o.femalewith amedical historyof BTL presents with concern for dental cavity on tooth number 9. Would like a referral to a dentist before the cavity becomes too large to cause pain or loss of tooth. Denies bleeding, suppuration, gum swelling, abscess, facial swelling, f/c/n/v, CP, or palpitations.        Outpatient Medications Prior to Visit  Medication Sig Dispense Refill  . atorvastatin (LIPITOR) 20 MG tablet Take 1 tablet (20 mg total) by mouth daily. 90 tablet 1  . Calcium Carbonate-Vitamin D (CALCIUM 500 + D PO) Take 1 tablet by mouth daily.    Marland Kitchen. glimepiride (AMARYL) 2 MG tablet Take 1 tablet (2 mg total) by mouth daily before breakfast. 30 tablet 3  . metFORMIN (GLUCOPHAGE) 1000 MG tablet Take 1 tablet (1,000 mg total) by mouth daily with breakfast. 90 tablet 3  . glucose blood test strip USE AS INSTRUCTED UP TO FOUR TIME PER DAY  12  . fluconazole (DIFLUCAN) 150 MG tablet Take 1 tablet (150 mg total) by mouth once a week. 2 tablet 0   No facility-administered medications prior to visit.      ROS Review of Systems  Constitutional: Negative for chills, fever and malaise/fatigue.  Eyes: Negative for blurred vision.  Respiratory: Negative for shortness of breath.   Cardiovascular: Negative for chest pain and palpitations.  Gastrointestinal: Negative for abdominal pain and nausea.  Genitourinary: Negative for dysuria and hematuria.  Musculoskeletal: Negative for joint pain and myalgias.  Skin: Negative for rash.  Neurological: Negative for tingling and headaches.  Psychiatric/Behavioral: Negative for depression. The patient is not nervous/anxious.     Objective:  BP 108/72 (BP Location: Left Arm, Patient Position: Sitting, Cuff Size: Normal)   Pulse 78   Temp 97.9 F (36.6 C) (Oral)   Ht 5\' 3"   (1.6 m)   Wt 160 lb 12.8 oz (72.9 kg)   LMP 05/06/2018 (Exact Date)   SpO2 98%   BMI 28.48 kg/m   BP/Weight 05/18/2018 04/25/2018 01/21/2018  Systolic BP 108 105 105  Diastolic BP 72 69 64  Wt. (Lbs) 160.8 164.2 152.8  BMI 28.48 29.09 27.07      Physical Exam  Constitutional: She is oriented to person, place, and time.  Well developed, well nourished, NAD, polite  HENT:  Head: Normocephalic and atraumatic.  Small cavity on lateral aspect of tooth number 9. No sign of abscess, bleeding, or facial/oropharyngeal swelling.  Eyes: No scleral icterus.  Neck: Normal range of motion. Neck supple. No thyromegaly present.  Cardiovascular: Normal rate, regular rhythm and normal heart sounds.  Pulmonary/Chest: Effort normal and breath sounds normal.  Musculoskeletal: She exhibits no edema.  Neurological: She is alert and oriented to person, place, and time.  Skin: Skin is warm and dry. No rash noted. No erythema. No pallor.  Psychiatric: She has a normal mood and affect. Her behavior is normal. Thought content normal.  Vitals reviewed.    Assessment & Plan:   1. Dental caries - Ambulatory referral to Dentistry - I asked receptionist to [facility] Unfortunately the wait to be seen is very long. Pt expected to be seen sometime after February 2020. Information to other dental offices given to patient but these will be out of pocket.     Follow-up: Return if symptoms worsen or fail  to improve.   Loletta Specter PA

## 2018-05-24 MED FILL — ?ATORVASTATIN 20 MG TABLET: 20 | 30 days supply | Qty: 30 | Fill #2

## 2018-05-24 MED FILL — ?METFORMIN HCL 1,000 MG TAB: 1000 | 30 days supply | Qty: 30 | Fill #1

## 2018-07-04 MED FILL — ?ATORVASTATIN 20 MG TABLET: 20 | 30 days supply | Qty: 30 | Fill #3

## 2018-07-04 MED FILL — ?METFORMIN HCL 1000MG TABS: 1000 | 30 days supply | Qty: 30 | Fill #2

## 2018-07-25 ENCOUNTER — Ambulatory Visit (INDEPENDENT_AMBULATORY_CARE_PROVIDER_SITE_OTHER): Payer: Self-pay | Admitting: Primary Care

## 2018-09-17 NOTE — Telephone Encounter (Signed)
done

## 2019-07-04 ENCOUNTER — Ambulatory Visit: Payer: Self-pay | Attending: Internal Medicine

## 2019-07-04 DIAGNOSIS — Z20822 Contact with and (suspected) exposure to covid-19: Secondary | ICD-10-CM | POA: Insufficient documentation

## 2019-07-05 LAB — NOVEL CORONAVIRUS, NAA: SARS-CoV-2, NAA: NOT DETECTED

## 2021-06-08 DIAGNOSIS — E119 Type 2 diabetes mellitus without complications: Secondary | ICD-10-CM | POA: Insufficient documentation

## 2021-08-06 ENCOUNTER — Ambulatory Visit: Payer: Self-pay | Admitting: Internal Medicine

## 2021-08-06 ENCOUNTER — Other Ambulatory Visit: Payer: Self-pay

## 2021-08-06 ENCOUNTER — Encounter: Payer: Self-pay | Admitting: Internal Medicine

## 2021-08-06 VITALS — BP 104/62 | HR 72 | Resp 12 | Ht 60.75 in | Wt 133.0 lb

## 2021-08-06 DIAGNOSIS — Z1231 Encounter for screening mammogram for malignant neoplasm of breast: Secondary | ICD-10-CM

## 2021-08-06 DIAGNOSIS — Z9189 Other specified personal risk factors, not elsewhere classified: Secondary | ICD-10-CM

## 2021-08-06 DIAGNOSIS — Z23 Encounter for immunization: Secondary | ICD-10-CM

## 2021-08-06 DIAGNOSIS — B351 Tinea unguium: Secondary | ICD-10-CM

## 2021-08-06 DIAGNOSIS — Z1159 Encounter for screening for other viral diseases: Secondary | ICD-10-CM

## 2021-08-06 DIAGNOSIS — N951 Menopausal and female climacteric states: Secondary | ICD-10-CM

## 2021-08-06 DIAGNOSIS — E119 Type 2 diabetes mellitus without complications: Secondary | ICD-10-CM

## 2021-08-06 DIAGNOSIS — E7800 Pure hypercholesterolemia, unspecified: Secondary | ICD-10-CM

## 2021-08-06 DIAGNOSIS — H11003 Unspecified pterygium of eye, bilateral: Secondary | ICD-10-CM | POA: Insufficient documentation

## 2021-08-06 MED ORDER — METFORMIN HCL ER 500 MG PO TB24: ORAL_TABLET | ORAL | 11 refills | Status: DC

## 2021-08-06 NOTE — Progress Notes (Signed)
? ? ?Subjective:  ?  ?Patient ID: [name], female   DOB: [birth year], 46 y.o.   MRN: [id number] ? ? ?HPI ? ?Here to establish ? ?Daughter, Malachi Bonds, interprets ?Josefina's sister (sister established last week) ? ? DM:   Diagnosed around 2019 and started on medication.  Has not had medication for about 2 years, however.  Just did not take the time to sign up for orange card.  Was previously followed at [facility] with [facility].  Last visit was 10/20/2017.    States she wouldn't take medication at times as she did not feel well taking it.  She describes having problems with a medication on an empty stomach.  Looks like she was weaned to Metformin 1000 mg once daily with A1C down to 5.6% with check back in  May of 2019.  Her A1C went up to 7.9%, however in November of 2019.  She did not have microalbuminuria last chek in 2019.   ?She is down 15 lbs from 2019.  Apparently, was quite ill at one point with unknown virus (not covid)  Not clear what this was.   ?Has not had eyes checked in some time. ? ?2.  Elevated LDL:  107 last check in 2019.  Was on Atorvastatin.   ? ?3.  Hot flashes:  periods not regular any longer.  Hot and cold at night.   ? ?4.  HM:  has not had COVID booster.  Did not obtain influenza vaccine this past season.   ? ?No outpatient medications have been marked as taking for the 08/06/21 encounter (Office Visit) with Julieanne Manson, MD.  ? ?Allergies  ?Allergen Reactions  ? Gabapentin Rash  ? ?Past Medical History:  ?Diagnosis Date  ? Anemia   ? Diabetes mellitus without complication (HCC)   ? Elevated LDL cholesterol level   ? Perimenopausal   ? ?Past Surgical History:  ?Procedure Laterality Date  ? BREAST CYST EXCISION Left 12/21/2017  ? Procedure: EXCISION OF LEFT BREAST CYST;  Surgeon: Harriette Bouillon, MD;  Location: [facility];  Service: General;  Laterality: Left;  ? CESAREAN SECTION    ? x 3  ? TUBAL LIGATION    ? ?Family History  ?Problem Relation Age of Onset   ? Hypertension Mother   ? Diabetes Mother   ? Hypertension Sister   ? Diabetes Sister   ? Diabetes Brother   ? Learning disabilities Son   ? ?Social History  ? ?Socioeconomic History  ? Marital status: Married  ?  Spouse name: Ascencion Cruz  ? Number of children: 3  ? Years of education: 6  ? Highest education level: 6th grade  ?Occupational History  ? Occupation: Housewife  ?Tobacco Use  ? Smoking status: Never  ? Smokeless tobacco: Never  ?Vaping Use  ? Vaping Use: Never used  ?Substance and Sexual Activity  ? Alcohol use: No  ? Drug use: No  ? Sexual activity: Yes  ?  Birth control/protection: Surgical  ?Other Topics Concern  ? Not on file  ?Social History Narrative  ? Lives home with husband and 3 children.   ? Has lived in [place]  ? ?Social Determinants of Health  ? ?Financial Resource Strain: Low Risk   ? Difficulty of Paying Living Expenses: Not hard at all  ?Food Insecurity: No Food Insecurity  ? Worried About Programme researcher, broadcasting/film/video in the Last Year: Never true  ? Ran Out of Food in the Last Year: Never true  ?  Transportation Needs: No Transportation Needs  ? Lack of Transportation (Medical): No  ? Lack of Transportation (Non-Medical): No  ?Physical Activity: Not on file  ?Stress: Not on file  ?Social Connections: Not on file  ?Intimate Partner Violence: Not At Risk  ? Fear of Current or Ex-Partner: No  ? Emotionally Abused: No  ? Physically Abused: No  ? Sexually Abused: No  ? ? ? ? ?Review of Systems ? ? ? ?Objective:  ? ?BP 104/62 (BP Location: Right Arm, Patient Position: Sitting, Cuff Size: Normal)   Pulse 72   Resp 12   Ht 5' 0.75" (1.543 m)   Wt 133 lb (60.3 kg)   LMP 07/09/2021 (Within Days)   BMI 25.34 kg/m?  ? ?Physical Exam ?NAD ?HEENT:  PERRL, EOMI.  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  Not involving visual field.  Discs sharp bilaterally.  TMs pearly gray, throat without injection. ?Neck:  Supple, No adenopathy,  no thyromegaly ?Chest:  CTA ?CV:  RRR with normal S1 and S2, No S3, S4 or murmur.  No carotid bruit.  Carotid, radial, femoral, DP and PT pulses normal and equal. ?Abd:  S, NT, No HSM or mass, + BS ?LE:  No edema. ?Diabetic Foot Exam - Simple   ?Simple Foot Form ?Diabetic Foot exam was performed with the following findings: Yes 08/06/2021 12:00 PM  ?Visual Inspection ?No deformities, no ulcerations, no other skin breakdown bilaterally: Yes ?See comments: Yes ?Sensation Testing ?Intact to touch and monofilament testing bilaterally: Yes ?Pulse Check ?Posterior Tibialis and Dorsalis pulse intact bilaterally: Yes ?Comments ?Thickening and crumbling with discoloration of distal great toenails ?  ? ? ? ?Assessment & Plan  ? ? DM:  Rx for Metformin ER 1000 mg at breakfast, but to hold on filling until receive A1C results with history of significant weight loss.  Eye referral.  Will send diabetic monitoring supplies if A1C still elevated and needs treatment to [facility].  Encouraged never getting lost to care again.  A1C, CBC, CMP, Urine microalbumin/crea ? ?2.  Elevated LDL:  await LP--she is not fasting today, but checking mainly for LDL. ? ?3.  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 ? ?4.  HM:  Moderna Bivalent booster, mammogram, dental referral.  Hep C screening. ? ?5.  Hot flashes:  describes symptoms of perimenopause.  Discussed dressing appropriately in layers to remove or put clothes back on as needed. ? ?6.  Toenail onychomycosis: no treatment for now. ? ?7.  Need for dental care:  dental referral. ?

## 2021-08-07 LAB — COMPREHENSIVE METABOLIC PANEL
ALT: 14 IU/L (ref 0–32)
AST: 19 IU/L (ref 0–40)
Albumin/Globulin Ratio: 1.5 (ref 1.2–2.2)
Albumin: 4.1 g/dL (ref 3.8–4.8)
Alkaline Phosphatase: 117 IU/L (ref 44–121)
BUN/Creatinine Ratio: 21 (ref 9–23)
BUN: 12 mg/dL (ref 6–24)
Bilirubin Total: 0.8 mg/dL (ref 0.0–1.2)
CO2: 22 mmol/L (ref 20–29)
Calcium: 9.2 mg/dL (ref 8.7–10.2)
Chloride: 96 mmol/L (ref 96–106)
Creatinine, Ser: 0.56 mg/dL — ABNORMAL LOW (ref 0.57–1.00)
Globulin, Total: 2.8 g/dL (ref 1.5–4.5)
Sodium: 134 mmol/L (ref 134–144)
Total Protein: 6.9 g/dL (ref 6.0–8.5)
eGFR: 114 mL/min/{1.73_m2} (ref >59)

## 2021-08-07 LAB — CBC WITH DIFFERENTIAL/PLATELET
Basophils Absolute: 0 10*3/uL (ref 0.0–0.2)
Basos: 1 %
EOS (ABSOLUTE): 0.1 10*3/uL (ref 0.0–0.4)
Eos: 1 %
Hematocrit: 39.8 % (ref 34.0–46.6)
Hemoglobin: 14.1 g/dL (ref 11.1–15.9)
Immature Grans (Abs): 0 10*3/uL (ref 0.0–0.1)
Immature Granulocytes: 0 %
Lymphocytes Absolute: 1.9 10*3/uL (ref 0.7–3.1)
Lymphs: 37 %
MCH: 32.9 pg (ref 26.6–33.0)
MCHC: 35.4 g/dL (ref 31.5–35.7)
MCV: 93 fL (ref 79–97)
Monocytes Absolute: 0.3 10*3/uL (ref 0.1–0.9)
Monocytes: 7 %
Neutrophils Absolute: 2.7 10*3/uL (ref 1.4–7.0)
Neutrophils: 54 %
Platelets: 189 10*3/uL (ref 150–450)
RBC: 4.29 x10E6/uL (ref 3.77–5.28)
RDW: 12.6 % (ref 11.7–15.4)
WBC: 5 10*3/uL (ref 3.4–10.8)

## 2021-08-07 LAB — MICROALBUMIN / CREATININE URINE RATIO
Creatinine, Urine: 26.5 mg/dL
Microalb/Creat Ratio: <11 mg/g creat (ref 0–29)
Microalbumin, Urine: <3 ug/mL

## 2021-08-07 LAB — LIPID PANEL W/O CHOL/HDL RATIO
Cholesterol, Total: 183 mg/dL (ref 100–199)
HDL: 52 mg/dL (ref >39)
LDL Chol Calc (NIH): 110 mg/dL — ABNORMAL HIGH (ref 0–99)
Triglycerides: 117 mg/dL (ref 0–149)
VLDL Cholesterol Cal: 21 mg/dL (ref 5–40)

## 2021-08-07 LAB — HGB A1C W/O EAG: Hgb A1c MFr Bld: 12.1 % — ABNORMAL HIGH (ref 4.8–5.6)

## 2021-08-07 LAB — HEPATITIS C ANTIBODY: Hep C Virus Ab: NONREACTIVE

## 2021-09-02 ENCOUNTER — Other Ambulatory Visit: Payer: Self-pay

## 2021-09-02 DIAGNOSIS — N6452 Nipple discharge: Secondary | ICD-10-CM

## 2021-11-04 ENCOUNTER — Ambulatory Visit: Payer: Self-pay

## 2021-11-04 ENCOUNTER — Other Ambulatory Visit: Payer: Self-pay

## 2021-11-05 ENCOUNTER — Telehealth: Payer: Self-pay | Admitting: *Deleted

## 2021-11-05 ENCOUNTER — Other Ambulatory Visit: Payer: Self-pay

## 2021-11-05 DIAGNOSIS — N644 Mastodynia: Secondary | ICD-10-CM

## 2021-11-05 NOTE — Telephone Encounter (Signed)
Thanks. We will reach out to her to get her rescheduled.

## 2021-11-05 NOTE — Telephone Encounter (Signed)
Voicemail from 11/04/21 left that was a  Female speaking English left message for patient who speaks Spanish stating she missed her appointment today at 2pm  and would like to reschedule and would like Spanish speaking person to call back.  Per chart review was Mammogram appt / [facility] patient and was scheduled by [facility]. Will forward to that office. Nancy Fetter

## 2021-11-10 ENCOUNTER — Other Ambulatory Visit: Payer: Self-pay

## 2021-11-10 DIAGNOSIS — E119 Type 2 diabetes mellitus without complications: Secondary | ICD-10-CM

## 2021-11-11 LAB — HEMOGLOBIN A1C
Est. average glucose Bld gHb Est-mCnc: 292 mg/dL
Hgb A1c MFr Bld: 11.8 % — ABNORMAL HIGH (ref 4.8–5.6)

## 2021-11-12 ENCOUNTER — Ambulatory Visit: Payer: Self-pay | Admitting: Internal Medicine

## 2021-11-12 ENCOUNTER — Encounter: Payer: Self-pay | Admitting: Internal Medicine

## 2021-11-12 VITALS — BP 100/68 | HR 76 | Resp 16 | Ht 61.0 in | Wt 132.0 lb

## 2021-11-12 DIAGNOSIS — E7800 Pure hypercholesterolemia, unspecified: Secondary | ICD-10-CM

## 2021-11-12 DIAGNOSIS — Z0000 Encounter for general adult medical examination without abnormal findings: Secondary | ICD-10-CM

## 2021-11-12 DIAGNOSIS — E119 Type 2 diabetes mellitus without complications: Secondary | ICD-10-CM

## 2021-11-12 DIAGNOSIS — E7841 Elevated Lipoprotein(a): Secondary | ICD-10-CM

## 2021-11-12 DIAGNOSIS — Z87898 Personal history of other specified conditions: Secondary | ICD-10-CM

## 2021-11-12 DIAGNOSIS — G5603 Carpal tunnel syndrome, bilateral upper limbs: Secondary | ICD-10-CM | POA: Insufficient documentation

## 2021-11-12 LAB — POCT URINALYSIS DIPSTICK
Bilirubin, UA: NEGATIVE
Blood, UA: NEGATIVE
Glucose, UA: POSITIVE — AB
Ketones, UA: NEGATIVE
Leukocytes, UA: NEGATIVE
Nitrite, UA: NEGATIVE
Protein, UA: NEGATIVE
Spec Grav, UA: 1.025 (ref 1.010–1.025)
Urobilinogen, UA: 0.2 E.U./dL
pH, UA: 6 (ref 5.0–8.0)

## 2021-11-12 MED ORDER — METFORMIN HCL ER 500 MG PO TB24: ORAL_TABLET | ORAL | 11 refills | Status: DC

## 2021-11-12 MED ORDER — ATORVASTATIN CALCIUM 20 MG PO TABS: ORAL_TABLET | ORAL | 11 refills | Status: DC

## 2021-11-12 NOTE — Progress Notes (Signed)
Subjective:    Patient ID: [name], female   DOB: Nov 23, 1975, 46 y.o.   MRN: [id number]   HPI  CPE without pap  1.  Pap:  Has pap planned with [facility] beginning of August.  Last performed in 2019 and normal.    2.  Mammogram: Missed her mammogram appt on 11/04/21.  Has been rescheduled for August with [facility].  Apparently having some nipple discharge--states on and off and minimal since birth of last child in 2012.  States looks like breast milk.  No family history of breast cancer.  She did have a benign sebaceous cyst removed from left breast in 2019 after mammogram/ultrasound showed a lesions  3.  Osteoprevention:  Takes calcium and Vitamin D.  Also drinks 2-3 cups of milk 2% daily.  Walks 40 minutes daily.    4.  Guaiac Cards/FIT:  Never.  5.  Colonoscopy:  Never.  No family history of colon cancer.    6.  Immunizations:  Immunization History  Administered Date(s) Administered   Influenza Inj Mdck Quad Pf 03/29/2018   Moderna Covid-19 Vaccine Bivalent Booster 6657yrs & up 08/06/2021   PFIZER Comirnaty(Gray Top)Covid-19 Tri-Sucrose Vaccine 08/22/2019, 09/25/2019   Pneumococcal Polysaccharide-23 01/21/2018   Tdap 01/21/2018     7.  Glucose/Cholesterol:  Carried diagnoses of elevated LDL and DM.  Not at goal with either--was lost to care for some time until established with us in March.  Not taking medication currently as recommended.  Only taking 500 mg once daily of Metformin ER. A1C recently 11.8%.  Lipid Panel     Component Value Date/Time   CHOL 183 08/06/2021 1213   TRIG 117 08/06/2021 1213   HDL 52 08/06/2021 1213   CHOLHDL 3.3 10/20/2017 1105   LDLCALC 110 (H) 08/06/2021 1213   LABVLDL 21 08/06/2021 1213     Current Meds  Medication Sig   Calcium Carbonate-Vitamin D (CALCIUM 500 + D PO) Take 1 tablet by mouth daily.   Ferrous Sulfate (IRON SUPPLEMENT PO) Take by mouth.   metFORMIN (GLUCOPHAGE-XR) 500 MG 24 hr tablet 2 tabs by mouth daily with  breakfast (Patient taking differently: Has been taking 1 tab daily)   Allergies  Allergen Reactions   Gabapentin Rash   Past Medical History:  Diagnosis Date   Anemia    Diabetes mellitus without complication (HCC)    Elevated LDL cholesterol level    Perimenopausal     Past Surgical History:  Procedure Laterality Date   BREAST CYST EXCISION Left 12/21/2017   Procedure: EXCISION OF LEFT BREAST CYST;  Surgeon: Harriette Bouillonornett, Thomas, MD;  Location: [facility];  Service: General;  Laterality: Left;   CESAREAN SECTION     x 3   TUBAL LIGATION     Family History  Problem Relation Age of Onset   Hypertension Mother    Diabetes Mother    Hypertension Sister    Diabetes Sister    Diabetes Brother    Learning disabilities Son      Review of Systems  Eyes:  Positive for visual disturbance (Some blurry vision--small letters in particular.).  Respiratory:  Negative for shortness of breath.   Cardiovascular:  Negative for chest pain, palpitations and leg swelling.  Gastrointestinal:  Negative for abdominal pain, blood in stool (No melena.), constipation and diarrhea.  Genitourinary:  Negative for dysuria.       Urine orange for past week.   Feels she drinks lots of water throughout day.  Has not  had a period for months.   Skin:  Negative for rash.  Neurological:  Negative for weakness and numbness.  Psychiatric/Behavioral:  Negative for dysphoric mood. The patient is not nervous/anxious.      Objective:   BP 100/68 (BP Location: Right Arm, Patient Position: Sitting, Cuff Size: Normal)   Pulse 76   Resp 16   Ht 5\' 1"  (1.549 m)   Wt 132 lb (59.9 kg)   BMI 24.94 kg/m   Physical Exam Constitutional:      Appearance: Normal appearance.  HENT:     Head: Normocephalic and atraumatic.     Right Ear: Tympanic membrane, ear canal and external ear normal.     Left Ear: Tympanic membrane, ear canal and external ear normal.     Nose: Nose normal.     Mouth/Throat:      Mouth: Mucous membranes are moist.     Pharynx: Oropharynx is clear.  Eyes:     Extraocular Movements: Extraocular movements intact.     Conjunctiva/sclera: Conjunctivae normal.     Pupils: Pupils are equal, round, and reactive to light.     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   Unable to see discs well due to small pupils  Neck:     Thyroid: No thyroid mass or thyromegaly.  Cardiovascular:     Rate and Rhythm: Normal rate and regular rhythm.     Pulses:          Dorsalis pedis pulses are 2+ on the right side and 2+ on the left side.       Posterior tibial pulses are 2+ on the right side and 2+ on the left side.     Heart sounds: S1 normal and S2 normal. No murmur heard.   No friction rub. No S3 or S4 sounds.     Comments: No carotid bruits.  Carotid, radial, femoral, DP and PT pulses normal and equal.    Pulmonary:     Effort: Pulmonary effort is normal.     Breath sounds: Normal breath sounds.  Chest:     Comments: Deferred as being [facility] at [facility] for gyne. Abdominal:     General: Bowel sounds are normal.     Palpations: Abdomen is soft. There is no hepatomegaly, splenomegaly or mass.     Tenderness: There is no abdominal tenderness.     Hernia: No hernia is present.  Genitourinary:    Comments: Deferred to [facility] Musculoskeletal:        General: Normal range of motion.     Cervical back: Normal range of motion and neck supple.  Feet:     Right foot:     Protective Sensation: 10 sites tested.  10 sites sensed.     Skin integrity: Skin integrity normal.     Left foot:     Protective Sensation: 10 sites tested.  10 sites sensed.     Skin integrity: Skin integrity normal.     Toenail Condition: Left toenails are abnormally thick.     Comments: Left great toenail only one involved Lymphadenopathy:     Head:     Right side of head: No submental or submandibular adenopathy.     Left side of head: No submental or submandibular adenopathy.      Cervical: No cervical adenopathy.     Upper Body:     Right upper body: No supraclavicular or axillary adenopathy.     Left upper body: No supraclavicular or axillary  adenopathy.     Lower Body: No right inguinal adenopathy. No left inguinal adenopathy.  Skin:    General: Skin is warm.     Capillary Refill: Capillary refill takes less than 2 seconds.     Findings: No rash.  Neurological:     General: No focal deficit present.     Mental Status: She is alert and oriented to person, place, and time.     Cranial Nerves: Cranial nerves 2-12 are intact.     Sensory: Sensation is intact.     Motor: Motor function is intact.     Coordination: Coordination is intact.     Gait: Gait is intact.     Deep Tendon Reflexes: Reflexes are normal and symmetric.  Psychiatric:        Attention and Perception: Attention normal.        Speech: Speech normal.        Behavior: Behavior normal.     Assessment & Plan    CPE without pap To obtain pap and Mammogram in August with [facility] Recommend influenza vaccine in fall-call in September Return FIT in 2 weeks.    2.  DM:  not well controlled.  Increase Metformin ER to 1000 mg twice daily with meals Referral in since March for optometry referral/diabetic eye exam. A1C in 3 months and f/u with me Call if does not tolerate.  3.  Elevated LDL:  Atorvastatin 20 mg daily.  FLP, hepatic profile in 6 weeks.  4.  Checking on dental referral as well from March  5.  Left great toenail onychomycosis:  wait on treatment until know tolerating treatment for DM first.  6.  Orange urine:  resolved now, but will check CC UA today

## 2021-12-29 ENCOUNTER — Other Ambulatory Visit: Payer: Self-pay

## 2021-12-29 DIAGNOSIS — E7800 Pure hypercholesterolemia, unspecified: Secondary | ICD-10-CM

## 2021-12-30 LAB — HEPATIC FUNCTION PANEL
ALT: 15 IU/L (ref 0–32)
AST: 16 IU/L (ref 0–40)
Albumin: 3.9 g/dL (ref 3.9–4.9)
Alkaline Phosphatase: 124 IU/L — ABNORMAL HIGH (ref 44–121)
Bilirubin Total: 0.7 mg/dL (ref 0.0–1.2)
Bilirubin, Direct: 0.16 mg/dL (ref 0.00–0.40)
Total Protein: 6.6 g/dL (ref 6.0–8.5)

## 2021-12-30 LAB — LIPID PANEL W/O CHOL/HDL RATIO
Cholesterol, Total: 173 mg/dL (ref 100–199)
HDL: 57 mg/dL (ref >39)
LDL Chol Calc (NIH): 101 mg/dL — ABNORMAL HIGH (ref 0–99)
Triglycerides: 79 mg/dL (ref 0–149)
VLDL Cholesterol Cal: 15 mg/dL (ref 5–40)

## 2022-01-06 ENCOUNTER — Ambulatory Visit: Payer: Self-pay | Admitting: *Deleted

## 2022-01-06 VITALS — BP 106/73 | Wt 130.4 lb

## 2022-01-06 DIAGNOSIS — Z1239 Encounter for other screening for malignant neoplasm of breast: Secondary | ICD-10-CM

## 2022-01-06 DIAGNOSIS — N644 Mastodynia: Secondary | ICD-10-CM

## 2022-01-06 DIAGNOSIS — Z1211 Encounter for screening for malignant neoplasm of colon: Secondary | ICD-10-CM

## 2022-01-06 NOTE — Progress Notes (Signed)
[name] is a 46 y.o. female who presents to [facility] clinic today with complaint of bilateral breast pain around nipple area that comes and goes x 5-6 months. Patient rates the pain at at 5-6 out of 10. Patient complained of left spontaneous scant amount of white discharge x 11 years.   Pap Smear: Pap smear not completed today. Last Pap smear was 11/04/2017 at [facility] clinic and was normal with negative HPV. Per patient has no history of an abnormal Pap smear. Last Pap smear result is available in Epic.   Physical exam: Breasts Breasts symmetrical. No skin abnormalities bilateral breasts. No nipple retraction right breast. Left nipple inverted that per patient is normal for her. No nipple discharge bilateral breasts. Unable to express any nipple discharge on exam. No lymphadenopathy. No lumps palpated bilateral breasts. No complaints of pain or tenderness on exam.     MM DIAG BREAST TOMO BILATERAL  Addendum Date: 11/05/2017   ADDENDUM REPORT: 11/05/2017 09:09 ADDENDUM: Per patient request, surgical consultation has been rescheduled with Dr. Harriette Jefferson at [facility] on November 19, 2017 for the probably benign complicated epidermal inclusion cyst of LEFT breast. The patient has been informed of the date and time of the appointment by [name], Bilingual Patient Services Representative. The patient agrees to date and time of this appointment. Reported by Julia Bob, RN Electronically Signed   By: [name] M.D.   On: 11/05/2017 09:09   Addendum Date: 11/04/2017   ADDENDUM REPORT: 11/04/2017 15:44 ADDENDUM: Surgical consultation has been arranged with Dr. Harriette [facility] at [facility] on 11/09/17. Patient has been informed of date and time of the appointment by [name], Bilingual Patient Services Representative. Reported by Julia Bob, RN Electronically Signed   By: [name] M.D.   On: 11/04/2017 15:44   Result Date: 11/05/2017 CLINICAL  DATA:  Palpable lump within the inner LEFT breast for 1 month. EXAM: DIGITAL DIAGNOSTIC BILATERAL MAMMOGRAM WITH CAD AND TOMO ULTRASOUND LEFT BREAST COMPARISON:  Previous LEFT breast ultrasound dated 09/24/2010. No previous mammograms. ACR Breast Density Category c: The breast tissue is heterogeneously dense, which may obscure small masses. FINDINGS: Bilateral CC and MLO views were obtained today, with additional 3D tomosynthesis, and with additional spot compression view of the inner LEFT breast corresponding to the area of clinical concern, with overlying skin marker in place. There is an oval circumscribed low-density mass within the inner LEFT breast, at superficial depth, possibly within the skin, measuring approximately 1.6 cm, corresponding to the palpable lump. There are no additional dominant masses, suspicious calcifications or secondary signs of malignancy within either breast. Mammographic images were processed with CAD. Targeted ultrasound is performed, showing an oval circumscribed mixed echogenicity mass within the LEFT breast at the 9 o'clock axis, 12 cm from the nipple (parasternal), measuring 1.8 x 1.1 x 1.7 cm, avascular, at superficial depth just below the skin. No skin tract identified. IMPRESSION: 1. Probably benign complicated epidermal inclusion cyst just below the skin of the LEFT breast at the 9 o'clock axis, 12 cm from the nipple (parasternal), measuring 1.8 cm, corresponding to the area of clinical concern. Recommend surgical excision for definitive treatment and to ensure benignity. Ultrasound-guided core biopsy is not recommended due to the possibility of causing chemical mastitis and/or abscess. 2. No evidence of malignancy within the RIGHT breast. RECOMMENDATION: Surgical excision of the superficial mass within the LEFT breast mass, presumed complicated epidermal inclusion cyst. Patient will be scheduled for surgical consultation with  [name]. Findings discussed with  Julia Canes, RN, with BCCCP at the time of today's exam. I have discussed the findings and recommendations with the patient. Results were also provided in writing at the conclusion of the visit. If applicable, a reminder letter will be sent to the patient regarding the next appointment. BI-RADS CATEGORY  3: Probably benign. Electronically Signed: By: [name] M.D. On: 11/04/2017 11:06     Pelvic/Bimanual Pap is not indicated today per BCCCP guidelines.   Smoking History: Patient has never smoked.   Patient Navigation: Patient education provided. Access to services provided for patient through [place] program. Spanish interpreter [name] from [facility] provided.   Colorectal Cancer Screening: Per patient has never had colonoscopy completed. FIT Test given to patient to complete. No complaints today.    Breast and Cervical Cancer Risk Assessment: Patient does not have family history of breast cancer, known genetic mutations, or radiation treatment to the chest before age 37. Patient does not have history of cervical dysplasia, immunocompromised, or DES exposure in-utero.  Risk Assessment     Risk Scores       01/06/2022   Last edited by: [name], CMA   5-year risk: 0.9 %   Lifetime risk: 8.2 %            A: BCCCP exam without pap smear Complaint of bilateral breast pain and left nipple discharge.  P: Referred patient to the [facility] of [facility] for a diagnostic mammogram. Appointment scheduled Thursday, January 15, 2022 at 1030.  Julia Heidelberg, RN 01/06/2022 2:16 PM

## 2022-01-06 NOTE — Patient Instructions (Signed)
Explained breast self awareness with [name]. Patient did not need a Pap smear today due to last Pap smear and HPV typing was 11/04/2017. Let her know BCCCP will cover Pap smears and HPV typing every 5 years unless has a history of abnormal Pap smears. Referred patient to the [facility] of [facility] for a diagnostic mammogram. Appointment scheduled Thursday, January 15, 2022 at 1030. Patient aware of appointment and will be there. [name] verbalized understanding.  Netta Fodge, Kathaleen Maser, RN 2:16 PM

## 2022-01-08 ENCOUNTER — Other Ambulatory Visit: Payer: Self-pay

## 2022-01-15 ENCOUNTER — Ambulatory Visit
Admission: RE | Admit: 2022-01-15 | Discharge: 2022-01-15 | Disposition: A | Discharge status: Home / Self Care | Payer: Self-pay | Source: Ambulatory Visit | Attending: Obstetrics and Gynecology | Admitting: Obstetrics and Gynecology

## 2022-01-15 ENCOUNTER — Ambulatory Visit
Admission: RE | Admit: 2022-01-15 | Discharge: 2022-01-15 | Disposition: A | Discharge status: Home / Self Care | Payer: No Typology Code available for payment source | Source: Ambulatory Visit | Attending: Obstetrics and Gynecology | Admitting: Obstetrics and Gynecology

## 2022-01-15 DIAGNOSIS — N6452 Nipple discharge: Secondary | ICD-10-CM

## 2022-01-15 DIAGNOSIS — N644 Mastodynia: Secondary | ICD-10-CM

## 2022-01-17 LAB — FECAL OCCULT BLOOD, IMMUNOCHEMICAL: Fecal Occult Bld: NEGATIVE

## 2022-01-19 ENCOUNTER — Telehealth: Payer: Self-pay

## 2022-01-19 NOTE — Telephone Encounter (Signed)
Via Delorise Royals, Spanish Interpreter [facility]), attempted to contact patient regarding lab results. Left message on voicemail requesting a return call.

## 2022-01-19 NOTE — Telephone Encounter (Signed)
Via Delorise Royals, Patient informed negative FIT test results, verbalized understanding.

## 2022-02-02 ENCOUNTER — Other Ambulatory Visit: Payer: Self-pay

## 2022-02-02 DIAGNOSIS — E119 Type 2 diabetes mellitus without complications: Secondary | ICD-10-CM

## 2022-02-03 LAB — HEMOGLOBIN A1C
Est. average glucose Bld gHb Est-mCnc: 315 mg/dL
Hgb A1c MFr Bld: 12.6 % — ABNORMAL HIGH (ref 4.8–5.6)

## 2022-02-16 ENCOUNTER — Ambulatory Visit: Payer: Self-pay | Admitting: Internal Medicine

## 2022-02-16 ENCOUNTER — Encounter: Payer: Self-pay | Admitting: Internal Medicine

## 2022-02-16 VITALS — BP 100/76 | HR 72 | Resp 12 | Ht 61.0 in | Wt 131.0 lb

## 2022-02-16 DIAGNOSIS — R4589 Other symptoms and signs involving emotional state: Secondary | ICD-10-CM

## 2022-02-16 DIAGNOSIS — E119 Type 2 diabetes mellitus without complications: Secondary | ICD-10-CM

## 2022-02-16 DIAGNOSIS — E7800 Pure hypercholesterolemia, unspecified: Secondary | ICD-10-CM

## 2022-02-16 NOTE — Progress Notes (Signed)
    Subjective:    Patient ID: [name], female   DOB: 1975-11-04, 46 y.o.   MRN: [id number]   HPI  [name] interprets   [name]:  A1C is higher at 12.6% than when not taking any medication for [name] in March (for 2 years) at 12.1.  Did get down to 11.8 in June.  She states she has been taking the Metformin ER 1000 mg twice daily with meals.  At one point in distant past, she may have also been taking Glimepiride, but does not recall.  Appears was last prescribed in 2019 when A1C was well controlled and before stopped all meds for 2 years.  She feels she is eating much healthier than was seen in March and also walking much more.   States when she was diagnosed with [name], weighed somewhere between 170 to 180 and has lost that weight.  Addendum:  called [name] pharmacy:  she picked up a 30 day prescription in March and then never refilled.     2.  Elevated LDL:  States taking Atorvastatin and refilling monthly.  Her LDL was not at goal end of July.  Showed almost no improvement with use of med.  She is not clear when she picked up and started.  Hepatic profile was fine after start of statin.   [facility]:  filled June 7th for 1 month and then never refilled  3.  Discussed Mammogram and U/S of breasts were normal.  She is not having the nipple pain or discharge as was having at time of order from [facility] exam.    Discussed repeat in 1 year.  4.  FIT was negative last month.  Current Meds  Medication Sig   atorvastatin (LIPITOR) 20 MG tablet 1 tab by mouth daily with evening meal   Calcium Carbonate-Vitamin D (CALCIUM 500 + D PO) Take 1 tablet by mouth daily.   Ferrous Sulfate (IRON SUPPLEMENT PO) Take by mouth.   metFORMIN (GLUCOPHAGE-XR) 500 MG 24 hr tablet 2 tabs by mouth twice daily with meals   Allergies  Allergen Reactions   Gabapentin Rash     Review of Systems    Objective:   BP 100/76 (BP Location: Left Arm, Patient Position: Sitting,  Cuff Size: Normal)   Pulse 72   Resp 12   Ht 5\' 1"  (1.549 m)   Wt 131 lb (59.4 kg)   BMI 24.75 kg/m   Physical Exam NAD HEENT:  PERRL, EOMI Neck:  Supple, No adenopathy, no thyromegaly Chest:  CTA CV:  RRR without murmur or rub.  Radial and DP pulses normal and equal.   Abd:  S, NT, No HSM or mass, + BS LE:  No edema.   Assessment & Plan    [name]:  after left, found that she has not been taking her medication for [name] nor elevated LDL.  Tried to call and have a conversation with her about why she is not taking her medication and stating she is, but not available.  Needs to better understand complications of [name] if not adequately controlled with time.   Check random insulin level also to see if she is adequately producing enough insulin for oral meds to make a difference.    2.  Elevated LDL:  as in #1.    3.  HM:  encouraged sign up for influenza and ultimately the new COVID vaccines.

## 2022-02-18 LAB — INSULIN, RANDOM: INSULIN: 5.9 u[IU]/mL (ref 2.6–24.9)

## 2022-02-20 ENCOUNTER — Other Ambulatory Visit (INDEPENDENT_AMBULATORY_CARE_PROVIDER_SITE_OTHER): Payer: Self-pay

## 2022-02-20 DIAGNOSIS — R300 Dysuria: Secondary | ICD-10-CM

## 2022-02-20 DIAGNOSIS — E119 Type 2 diabetes mellitus without complications: Secondary | ICD-10-CM

## 2022-02-20 LAB — POCT URINALYSIS DIPSTICK
Bilirubin, UA: NEGATIVE
Blood, UA: POSITIVE
Glucose, UA: POSITIVE — AB
Ketones, UA: NEGATIVE
Nitrite, UA: NEGATIVE
Protein, UA: NEGATIVE
Spec Grav, UA: 1.015 (ref 1.010–1.025)
Urobilinogen, UA: 0.2 E.U./dL
pH, UA: 6 (ref 5.0–8.0)

## 2022-02-20 MED ORDER — CIPROFLOXACIN HCL 500 MG PO TABS: 500.0000 mg | ORAL_TABLET | Freq: Two times a day (BID) | ORAL | 0 refills | Status: AC

## 2022-02-20 MED ORDER — GLIMEPIRIDE 2 MG PO TABS: 2.0000 mg | ORAL_TABLET | Freq: Every day | ORAL | 3 refills | Status: DC

## 2022-02-20 NOTE — Addendum Note (Signed)
Addended by: Duayne Cal on: 02/20/2022 12:23 PM   Modules accepted: Orders

## 2022-02-24 LAB — URINE CULTURE

## 2022-05-19 ENCOUNTER — Other Ambulatory Visit: Payer: Self-pay

## 2022-05-19 DIAGNOSIS — E7800 Pure hypercholesterolemia, unspecified: Secondary | ICD-10-CM

## 2022-05-19 DIAGNOSIS — E119 Type 2 diabetes mellitus without complications: Secondary | ICD-10-CM

## 2022-05-20 LAB — HEMOGLOBIN A1C
Est. average glucose Bld gHb Est-mCnc: 306 mg/dL
Hgb A1c MFr Bld: 12.3 % — ABNORMAL HIGH (ref 4.8–5.6)

## 2022-05-20 LAB — LIPID PANEL W/O CHOL/HDL RATIO
Cholesterol, Total: 167 mg/dL (ref 100–199)
HDL: 52 mg/dL (ref >39)
LDL Chol Calc (NIH): 98 mg/dL (ref 0–99)
Triglycerides: 89 mg/dL (ref 0–149)
VLDL Cholesterol Cal: 17 mg/dL (ref 5–40)

## 2022-07-04 ENCOUNTER — Telehealth: Payer: Self-pay | Admitting: Internal Medicine

## 2022-07-10 ENCOUNTER — Other Ambulatory Visit: Payer: Self-pay

## 2022-07-10 DIAGNOSIS — E119 Type 2 diabetes mellitus without complications: Secondary | ICD-10-CM

## 2022-07-10 MED ORDER — GLIMEPIRIDE 2 MG PO TABS: 2.0000 mg | ORAL_TABLET | Freq: Every day | ORAL | 7 refills | Status: DC

## 2022-07-10 NOTE — Telephone Encounter (Signed)
Pt stated she is only taking metformin. Pt was reminded what pharmacy she can pick up rx atorvastatin and glimepiride. Pt is only available on Wednesday so her appt is on 09/02/2022.

## 2022-09-02 ENCOUNTER — Ambulatory Visit: Payer: Self-pay | Admitting: Internal Medicine

## 2022-09-02 VITALS — BP 112/68 | HR 80 | Resp 16 | Ht 61.0 in | Wt 129.0 lb

## 2022-09-02 DIAGNOSIS — E7800 Pure hypercholesterolemia, unspecified: Secondary | ICD-10-CM

## 2022-09-02 DIAGNOSIS — E119 Type 2 diabetes mellitus without complications: Secondary | ICD-10-CM

## 2022-09-02 MED ORDER — EMPAGLIFLOZIN 10 MG PO TABS: 10.0000 mg | ORAL_TABLET | Freq: Every day | ORAL | 11 refills | Status: AC

## 2022-09-02 NOTE — Progress Notes (Signed)
    Subjective:    Patient ID: [name], female   DOB: 1975/12/13, 47 y.o.   MRN: [id number]   HPI  [name] interprets   DM:  A1C remained above 12% in December and patient for some reason did not have a follow up.  She was contacted end of January and shared she was only taking Metformin, not Atorvastatin or Glimepiride.   She states she started the glimepiride again in February as well as Atorvastatin.    Not checking her sugars.  States she does not as she continues to eat any way she prefers and knows her sugars would not be where they should.    Breakfast:  cereal 2-3 times weekly.  Otherwise, sliced fruit.    Lunch:  chicken with veggies or veggies.rice--has cut back.  Eating 2 tortillas per meal.  Does crave cookies and bread, especially pan dulce.  When has pan dulce, eats 3 times weekly.     2.  Elevated LDL  As above:  has been taking Atorvastatin since beginning of  February  Current Meds  Medication Sig   atorvastatin (LIPITOR) 20 MG tablet 1 tab by mouth daily with evening meal   Calcium Carbonate-Vitamin D (CALCIUM 500 + D PO) Take 1 tablet by mouth daily.   Ferrous Sulfate (IRON SUPPLEMENT PO) Take by mouth.   glimepiride (AMARYL) 2 MG tablet Take 1 tablet (2 mg total) by mouth daily before breakfast.   metFORMIN (GLUCOPHAGE-XR) 500 MG 24 hr tablet 2 tabs by mouth twice daily with meals   Allergies  Allergen Reactions   Gabapentin Rash     Review of Systems    Objective:   BP 112/68 (BP Location: Left Arm, Patient Position: Sitting, Cuff Size: Normal)   Pulse 80   Resp 16   Ht 5\' 1"  (1.549 m)   Wt 129 lb (58.5 kg)   BMI 24.37 kg/m   Physical Exam NAD HEENT:  PERRL, EOMI, TMs pearly gray, throat without injection Neck;  Supple, No adenopathy Chest:  CTA CV:  RRR without murmur or rub.  Radial and DP pulses normal and equal Abd:  S, NT, No HSM or mass, + BS LE:  No edema.   Assessment & Plan   DM:  poorly controlled.   Not clear what her thinking is regarding care of her chronic health issues.  Obviously, trying to ignore with her stance on not checking sugars as she is not eating well much of time.  Long discussion again regarding prevention of complications by getting sugars and cholesterol down with lifestyle and taking meds regularly.  A1C, CMP.  Referral for diabetic eye exam.   Start Jardiance 10 mg daily with breakfast and take other meds regularly.  2.  Hyperlipidemia:  Atorvastatin.  Lipids today.    3.  HM:  refusing further COVID vaccines.  Encouraged her in future to obtain influenza vaccine every year.  Schedule for follow labs in 3 months and CPE--she may be getting pap with BCCCP.

## 2022-09-03 LAB — LIPID PANEL W/O CHOL/HDL RATIO
Cholesterol, Total: 168 mg/dL (ref 100–199)
HDL: 52 mg/dL (ref >39)
LDL Chol Calc (NIH): 84 mg/dL (ref 0–99)
Triglycerides: 193 mg/dL — ABNORMAL HIGH (ref 0–149)
VLDL Cholesterol Cal: 32 mg/dL (ref 5–40)

## 2022-09-03 LAB — COMPREHENSIVE METABOLIC PANEL
ALT: 18 IU/L (ref 0–32)
AST: 18 IU/L (ref 0–40)
Albumin/Globulin Ratio: 1.6 (ref 1.2–2.2)
Albumin: 3.9 g/dL (ref 3.9–4.9)
Alkaline Phosphatase: 120 IU/L (ref 44–121)
BUN/Creatinine Ratio: 24 — ABNORMAL HIGH (ref 9–23)
BUN: 12 mg/dL (ref 6–24)
Bilirubin Total: 0.6 mg/dL (ref 0.0–1.2)
CO2: 19 mmol/L — ABNORMAL LOW (ref 20–29)
Calcium: 8.6 mg/dL — ABNORMAL LOW (ref 8.7–10.2)
Chloride: 102 mmol/L (ref 96–106)
Creatinine, Ser: 0.49 mg/dL — ABNORMAL LOW (ref 0.57–1.00)
Globulin, Total: 2.5 g/dL (ref 1.5–4.5)
Glucose: 386 mg/dL — ABNORMAL HIGH (ref 70–99)
Potassium: 4 mmol/L (ref 3.5–5.2)
Sodium: 137 mmol/L (ref 134–144)
Total Protein: 6.4 g/dL (ref 6.0–8.5)
eGFR: 117 mL/min/{1.73_m2} (ref >59)

## 2022-09-03 LAB — HGB A1C W/O EAG: Hgb A1c MFr Bld: 12.2 % — ABNORMAL HIGH (ref 4.8–5.6)

## 2022-09-12 ENCOUNTER — Encounter: Payer: Self-pay | Admitting: Internal Medicine

## 2022-09-12 MED ORDER — ATORVASTATIN CALCIUM 40 MG PO TABS: ORAL_TABLET | ORAL | 11 refills | Status: AC

## 2022-09-12 MED ORDER — METFORMIN HCL ER 500 MG PO TB24: ORAL_TABLET | ORAL | 11 refills | Status: DC

## 2022-09-12 MED ORDER — GLIPIZIDE 10 MG PO TABS: 10.0000 mg | ORAL_TABLET | Freq: Two times a day (BID) | ORAL | 11 refills | Status: AC

## 2022-09-12 NOTE — Addendum Note (Signed)
Addended by: Marcene Duos on: 09/12/2022 09:53 AM   Modules accepted: Orders

## 2023-01-01 ENCOUNTER — Other Ambulatory Visit: Payer: Self-pay

## 2023-01-01 DIAGNOSIS — E119 Type 2 diabetes mellitus without complications: Secondary | ICD-10-CM

## 2023-01-01 DIAGNOSIS — E7800 Pure hypercholesterolemia, unspecified: Secondary | ICD-10-CM

## 2023-01-06 ENCOUNTER — Encounter: Payer: Self-pay | Admitting: Internal Medicine

## 2023-02-23 ENCOUNTER — Telehealth: Payer: Self-pay | Admitting: Internal Medicine

## 2023-02-23 NOTE — Telephone Encounter (Signed)
Patient needs a dental referral, patient stated she has two loose molars and bad pain.

## 2023-03-04 ENCOUNTER — Other Ambulatory Visit: Payer: Self-pay | Admitting: Obstetrics and Gynecology

## 2023-03-04 DIAGNOSIS — Z1231 Encounter for screening mammogram for malignant neoplasm of breast: Secondary | ICD-10-CM

## 2023-03-12 NOTE — Telephone Encounter (Signed)
Patient has been scheduled

## 2023-03-15 ENCOUNTER — Ambulatory Visit: Payer: Self-pay | Admitting: Internal Medicine

## 2023-03-15 ENCOUNTER — Encounter: Payer: Self-pay | Admitting: Internal Medicine

## 2023-03-15 VITALS — BP 110/64 | HR 83 | Resp 16 | Ht 61.0 in | Wt 127.0 lb

## 2023-03-15 DIAGNOSIS — K047 Periapical abscess without sinus: Secondary | ICD-10-CM

## 2023-03-15 DIAGNOSIS — Z23 Encounter for immunization: Secondary | ICD-10-CM

## 2023-03-15 MED ORDER — PENICILLIN V POTASSIUM 250 MG PO TABS: 250.0000 mg | ORAL_TABLET | Freq: Four times a day (QID) | ORAL | 0 refills | Status: DC

## 2023-03-15 NOTE — Progress Notes (Cosign Needed)
     Subjective:     Patient ID: [name], female    DOB: [birth year], 47 y.o.   MRN: [id number]    Dental Pain   Dental Pain:  Constant pain to last two molars on left upper mouth for the past 6 months, worse over the past 1 month. Pain worse with cold beverages/food. Has had intermittent left sided facial swelling as well. Taking Tylenol 500mg  PO BID, minimal improvement in pain. Wants a referral to have both molars extracted. No fevers. No purulent drainage.    ROS  Allergies  Allergen Reactions   Gabapentin Rash    Current Meds  Medication Sig   atorvastatin (LIPITOR) 40 MG tablet 1 tab by mouth daily with evening meal   Calcium Carbonate-Vitamin D (CALCIUM 500 + D PO) Take 1 tablet by mouth daily.   empagliflozin (JARDIANCE) 10 MG TABS tablet Take 1 tablet (10 mg total) by mouth daily before breakfast.   Ferrous Sulfate (IRON SUPPLEMENT PO) Take by mouth.   glipiZIDE (GLUCOTROL) 10 MG tablet Take 1 tablet (10 mg total) by mouth 2 (two) times daily before a meal.   metFORMIN (GLUCOPHAGE-XR) 500 MG 24 hr tablet 2 tabs by mouth twice daily with meals        Objective:    BP 110/64 (BP Location: Left Arm, Patient Position: Sitting, Cuff Size: Normal)   Pulse 83   Resp 16   Ht 5\' 1"  (1.549 m)   Wt 127 lb (57.6 kg)   BMI 24.00 kg/m    Physical Exam HENT:     Mouth/Throat:     Dentition: Gingival swelling and dental caries present.      Comments: Mild swelling of the left medial gingival aspect.        Assessment & Plan:  Dental Pain: Start taking Penicillin for 7 days. Refer to Dentist for possible dental extraction.   HM: Influenza vaccine today.    Macarthur Critchley, Haroldine Laws FNP Student

## 2023-03-23 ENCOUNTER — Ambulatory Visit
Admission: RE | Admit: 2023-03-23 | Discharge: 2023-03-23 | Disposition: A | Discharge status: Home / Self Care | Payer: No Typology Code available for payment source | Source: Ambulatory Visit | Attending: Obstetrics and Gynecology | Admitting: Obstetrics and Gynecology

## 2023-03-23 ENCOUNTER — Ambulatory Visit: Payer: Self-pay | Admitting: Hematology and Oncology

## 2023-03-23 VITALS — BP 96/56 | Wt 126.0 lb

## 2023-03-23 DIAGNOSIS — Z01419 Encounter for gynecological examination (general) (routine) without abnormal findings: Secondary | ICD-10-CM

## 2023-03-23 DIAGNOSIS — Z1211 Encounter for screening for malignant neoplasm of colon: Secondary | ICD-10-CM

## 2023-03-23 DIAGNOSIS — Z1231 Encounter for screening mammogram for malignant neoplasm of breast: Secondary | ICD-10-CM

## 2023-03-23 NOTE — Patient Instructions (Addendum)
Taught [name] about self breast awareness and gave educational materials to take home. Patient did need a Pap smear today due to last Pap smear was in  11/04/2017 per patient. Let her know BCCCP will cover Pap smears every 5 years unless has a history of abnormal Pap smears. Referred patient to the [facility] for screening mammogram. Appointment scheduled for 03/23/2023. Patient aware of appointment and will be there. Let patient know will follow up with her within the next couple weeks with results. [name] verbalized understanding.  Pascal Lux, NP 10:34 AM

## 2023-03-23 NOTE — Progress Notes (Signed)
[name] is a 47 y.o. [phone] female who presents to [facility] clinic today with no complaints.    Pap Smear: Pap smear completed today. Last Pap smear was 11/04/2017 and was normal. Per patient has no history of an abnormal Pap smear. Last Pap smear result is available in Epic.   Physical exam: Breasts Breasts symmetrical. No skin abnormalities bilateral breasts. No nipple retraction bilateral breasts. No nipple discharge bilateral breasts. No lymphadenopathy. No lumps palpated bilateral breasts.    MS DIGITAL DIAG TOMO BILAT  Result Date: 01/15/2022 CLINICAL DATA:  Patient reports intermittent bilateral nipple pain but currently denies pain on today's examination. Per chart review, the ordering provider described possible left milky nipple discharge but the patient denies this symptom on today's exam. EXAM: DIGITAL DIAGNOSTIC BILATERAL MAMMOGRAM WITH TOMOSYNTHESIS; ULTRASOUND LEFT BREAST LIMITED; ULTRASOUND RIGHT BREAST LIMITED TECHNIQUE: Bilateral digital diagnostic mammography and breast tomosynthesis was performed.; Targeted ultrasound examination of the left breast was performed.; Targeted ultrasound examination of the right breast was performed COMPARISON:  Previous exam(s). ACR Breast Density Category c: The breast tissue is heterogeneously dense, which may obscure small masses. FINDINGS: Diagnostic mammographic images were obtained over the area of nipple pain in bilateral breasts. No suspicious mammographic finding is identified in this area. No suspicious mass, microcalcification, or other finding is identified in bilateral breasts. Targeted ultrasound was performed of bilateral periareolar breasts given history of bilateral nipple pain and left nipple discharge. No suspicious solid or cystic mass is identified. IMPRESSION: 1. No mammographic or sonographic etiology for the patient's bilateral nipple pain and left non-bloody nipple discharge. 2. No mammographic evidence of  malignancy in bilateral breasts. RECOMMENDATION: Any further workup of the patient's symptoms should be based on the clinical assessment. Recommend routine annual screening mammogram in 1 year, which is due in August 2024. I have discussed the findings and recommendations with the patient. If applicable, a reminder letter will be sent to the patient regarding the next appointment. BI-RADS CATEGORY  1: Negative. Electronically Signed   By: Jacob Moores M.D.   On: 01/15/2022 12:18      Pelvic/Bimanual Ext Genitalia No lesions, no swelling and no discharge observed on external genitalia.        Vagina Vagina pink and normal texture. No lesions or discharge observed in vagina.        Cervix Cervix is present. Cervix pink and of normal texture. No discharge observed.    Uterus Uterus is present and palpable. Uterus in normal position and normal size.        Adnexae Bilateral ovaries present and palpable. No tenderness on palpation.         Rectovaginal No rectal exam completed today since patient had no rectal complaints. No skin abnormalities observed on exam.     Smoking History: Patient has never smoked and was not referred to quit line.    Patient Navigation: Patient education provided. Access to services provided for patient through [facility]CP program. [name] interpreter provided. No transportation provided   Colorectal Cancer Screening: Per patient has never had colonoscopy completed No complaints today. FIT test given.    Breast and Cervical Cancer Risk Assessment: Patient does not have family history of breast cancer, known genetic mutations, or radiation treatment to the chest before age 62. Patient does not have history of cervical dysplasia, immunocompromised, or DES exposure in-utero.   Risk Scores as of Encounter on 03/23/2023     [name]  5-year 0.89%   Lifetime 9.46%   This patient is [place] but has no documented birth country, so the [place]  model used data from [place] patients to calculate their risk score. Document a birth country in the Demographics activity for a more accurate score.         Last calculated by Caprice Red, CMA on 03/23/2023 at 10:39 AM         A: BCCCP exam with pap smear No complaints with benign exam.   P: Referred patient to the [facility] for a screening mammogram. Appointment scheduled 03/23/2023.  Pascal Lux, NP 03/23/2023 10:33 AM

## 2023-03-24 LAB — CYTOLOGY - PAP
Adequacy: ABSENT
Comment: NEGATIVE
Diagnosis: NEGATIVE
High risk HPV: NEGATIVE

## 2023-03-30 LAB — FECAL OCCULT BLOOD, IMMUNOCHEMICAL: Fecal Occult Bld: NEGATIVE

## 2023-03-30 LAB — SPECIMEN STATUS REPORT

## 2023-04-19 ENCOUNTER — Telehealth: Payer: Self-pay

## 2023-04-19 NOTE — Telephone Encounter (Signed)
Called patient per Fonnie Mu. Used [employer] #[id number]. Explained that pap smear was normal but did show candida. Patient endorses no itching or discharge. Offered Rx of Diflucan. Patient declined due to no symptoms. Patient voiced understanding of results.

## 2023-05-19 ENCOUNTER — Ambulatory Visit: Payer: Self-pay | Admitting: Internal Medicine

## 2023-05-19 ENCOUNTER — Encounter: Payer: Self-pay | Admitting: Internal Medicine

## 2023-05-19 VITALS — BP 100/70 | HR 81 | Resp 16 | Ht 61.0 in | Wt 126.0 lb

## 2023-05-19 DIAGNOSIS — Z0000 Encounter for general adult medical examination without abnormal findings: Secondary | ICD-10-CM

## 2023-05-19 DIAGNOSIS — E7800 Pure hypercholesterolemia, unspecified: Secondary | ICD-10-CM

## 2023-05-19 DIAGNOSIS — E119 Type 2 diabetes mellitus without complications: Secondary | ICD-10-CM

## 2023-05-19 DIAGNOSIS — K047 Periapical abscess without sinus: Secondary | ICD-10-CM

## 2023-05-19 NOTE — Progress Notes (Signed)
Subjective:    Patient ID: [name], female   DOB: 03/04/1976, 47 y.o.   MRN: [id number]   HPI  CPE without pap  1.  Pap:  performed 04/02/2023 with well woman exam.    2.  Mammogram:  Performed in October as well and normal.  No family history of breast cancer.   3.  Osteoprevention:  Drinks milk 3 times daily.   Walks 60 minutes daily.    4.  Guaiac Cards/FIT:  FIT negative with October Well Woman visit as well.    5.  Colonoscopy:  Never.  No family history of colon cancer.    6.  Immunizations:  Has not had COVID booster this year.   Immunization History  Administered Date(s) Administered   Influenza Inj Mdck Quad Pf 03/29/2018   Influenza, Mdck, Trivalent,PF 6+ MOS(egg free) 03/15/2023   Moderna Covid-19 Vaccine Bivalent Booster 63yrs & up 08/06/2021   PFIZER Comirnaty(Gray Top)Covid-19 Tri-Sucrose Vaccine 08/22/2019, 09/25/2019   Pneumococcal Polysaccharide-23 01/21/2018   Tdap 01/21/2018     7.  Glucose/Cholesterol:  A1C was 12.2% in July.  Previous insulin level low normal.  Cholesterol, however, at goal.  Feels she could be more consistent with healthier eating.  Not missing meds, later qualifies that she does miss meds at times.   Lipid Panel     Component Value Date/Time   CHOL 119 01/01/2023 0823   TRIG 81 01/01/2023 0823   HDL 46 01/01/2023 0823   CHOLHDL 3.3 10/20/2017 1105   LDLCALC 57 01/01/2023 0823   LABVLDL 16 01/01/2023 0823     Current Meds  Medication Sig   atorvastatin (LIPITOR) 40 MG tablet 1 tab by mouth daily with evening meal   Calcium Carbonate-Vitamin D (CALCIUM 500 + D PO) Take 1 tablet by mouth daily.   empagliflozin (JARDIANCE) 10 MG TABS tablet Take 1 tablet (10 mg total) by mouth daily before breakfast.   Ferrous Sulfate (IRON SUPPLEMENT PO) Take by mouth.   glipiZIDE (GLUCOTROL) 10 MG tablet Take 1 tablet (10 mg total) by mouth 2 (two) times daily before a meal.   metFORMIN (GLUCOPHAGE-XR) 500 MG 24 hr tablet 2  tabs by mouth twice daily with meals   Allergies  Allergen Reactions   Gabapentin Rash   Past Medical History:  Diagnosis Date   Anemia    Carpal tunnel syndrome on both sides    Diabetes mellitus without complication (HCC)    Elevated LDL cholesterol level    Perimenopausal    Past Surgical History:  Procedure Laterality Date   BREAST CYST EXCISION Left 12/21/2017   Procedure: EXCISION OF LEFT BREAST CYST;  Surgeon: Harriette Bouillon, MD;  Location: [facility];  Service: General;  Laterality: Left;   CESAREAN SECTION     x 3   TUBAL LIGATION     Family History  Problem Relation Age of Onset   Hypertension Mother    Diabetes Mother    Hypertension Sister    Diabetes Sister    Diabetes Brother    Learning disabilities Son    Breast cancer Neg Hx    Social History   Socioeconomic History   Marital status: Married    Spouse name: Ascencion Cruz   Number of children: 3   Years of education: 6   Highest education level: 6th grade  Occupational History   Occupation: Housewife  Tobacco Use   Smoking status: Never    Passive exposure: Never   Smokeless tobacco: Never  Vaping Use   Vaping status: Never Used  Substance and Sexual Activity   Alcohol use: No   Drug use: No   Sexual activity: Yes    Birth control/protection: Surgical    Comment: Tubal ligation  Other Topics Concern   Not on file  Social History Narrative   Lives home with husband and 3 children.    Has [place]   Social Determinants of Health   Financial Resource Strain: Low Risk  (05/19/2023)   Overall Financial Resource Strain (CARDIA)    Difficulty of Paying Living Expenses: Not very hard  Food Insecurity: No Food Insecurity (05/19/2023)   Hunger Vital Sign    Worried About Running Out of Food in the Last Year: Never true    Ran Out of Food in the Last Year: Never true  Transportation Needs: No Transportation Needs (05/19/2023)   PRAPARE - Scientist, research (physical sciences) (Medical): No    Lack of Transportation (Non-Medical): No  Physical Activity: Not on file  Stress: Not on file  Social Connections: Not on file  Intimate Partner Violence: Not At Risk (05/19/2023)   Humiliation, Afraid, Rape, and Kick questionnaire    Fear of Current or Ex-Partner: No    Emotionally Abused: No    Physically Abused: No    Sexually Abused: No     Review of Systems  HENT:  Positive for dental problem (Has not heard anything from dental clinic.).   Eyes:  Negative for visual disturbance (Did have eye check this year.).  Respiratory:  Negative for shortness of breath.   Cardiovascular:  Negative for chest pain, palpitations and leg swelling.  Gastrointestinal:  Negative for abdominal pain and blood in stool (No melena).  Neurological:  Negative for weakness and numbness.  Psychiatric/Behavioral:  Negative for dysphoric mood. The patient is not nervous/anxious.       Objective:   BP 100/70 (BP Location: Left Arm, Patient Position: Sitting, Cuff Size: Normal)   Pulse 81   Resp 16   Ht 5\' 1"  (1.549 m)   Wt 126 lb (57.2 kg)   BMI 23.81 kg/m   Physical Exam Constitutional:      Appearance: She is normal weight.  HENT:     Head: Normocephalic and atraumatic.     Right Ear: Tympanic membrane, ear canal and external ear normal.     Left Ear: Tympanic membrane, ear canal and external ear normal.     Nose: Nose normal.     Mouth/Throat:     Mouth: Mucous membranes are moist.     Pharynx: Oropharynx is clear.  Eyes:     Extraocular Movements: Extraocular movements intact.     Conjunctiva/sclera: Conjunctivae normal.     Pupils: Pupils are equal, round, and reactive to light.  Neck:     Thyroid: No thyroid mass or thyromegaly.  Cardiovascular:     Rate and Rhythm: Normal rate and regular rhythm.     Pulses:          Dorsalis pedis pulses are 2+ on the right side and 2+ on the left side.       Posterior tibial pulses are 2+ on the right side and  2+ on the left side.     Heart sounds: S1 normal and S2 normal. No murmur heard.    No friction rub. No S3 or S4 sounds.     Comments: No carotid bruits.  Carotid, radial, femoral, DP and PT pulses  normal and equal.   Pulmonary:     Effort: Pulmonary effort is normal.     Breath sounds: Normal breath sounds and air entry.  Chest:     Comments: Deferred as performed with [facility] in October Abdominal:     General: Abdomen is flat. Bowel sounds are normal.     Palpations: Abdomen is soft. There is no hepatomegaly, splenomegaly or mass.     Tenderness: There is no abdominal tenderness.     Hernia: No hernia is present.  Genitourinary:    Comments: Deferred as performed with Well Woman exam in October. Musculoskeletal:        General: Normal range of motion.     Cervical back: Normal range of motion and neck supple.     Right lower leg: No edema.     Left lower leg: No edema.  Feet:     Right foot:     Protective Sensation: 10 sites tested.  10 sites sensed.     Skin integrity: Skin integrity normal.     Toenail Condition: Right toenails are normal.     Left foot:     Protective Sensation: 10 sites tested.  10 sites sensed.     Skin integrity: Skin integrity normal.     Toenail Condition: Left toenails are abnormally thick.  Lymphadenopathy:     Head:     Right side of head: No submental or submandibular adenopathy.     Left side of head: No submental or submandibular adenopathy.     Cervical: No cervical adenopathy.     Upper Body:     Right upper body: No supraclavicular adenopathy.     Left upper body: No supraclavicular adenopathy.     Lower Body: No right inguinal adenopathy. No left inguinal adenopathy.  Skin:    General: Skin is warm.     Capillary Refill: Capillary refill takes less than 2 seconds.     Findings: No lesion or rash.  Neurological:     General: No focal deficit present.     Mental Status: She is alert and oriented to person, place, and time.      Cranial Nerves: Cranial nerves 2-12 are intact.     Sensory: Sensation is intact.     Motor: Motor function is intact.     Coordination: Coordination is intact.     Gait: Gait is intact.     Deep Tendon Reflexes: Reflexes are normal and symmetric.  Psychiatric:        Behavior: Behavior normal. Behavior is cooperative.     Assessment & Plan   CPE without pap Pap, mammogram and FIT already performed in past 2 months with well woman.   Encouraged COVID booster at pharmacy of choice as we are out and not clear when we will receive new shipment.  2.  Elevated LDL:  at goal in July, but has not filled her Atorvastatin since August.  Lipids, nonfasting.  3.  DM:  poorly controlled in past.  A1C, urine microalbumin/crea  Addendum:  has not filled metformin nor glipizide since May at [facility].  Long conversation to see if barriers to obtaining meds.  Ultimately, still concerned she is in denial regarding her chronic health issues.   Discussed not even certain if the meds she is taking are best for her, but not able to ascertain as she generally hs not been taking them consistently when follows up for visit or labs.   She has an appt with [facility] scheduled for March and hoping she can find a provider to bring that home for her.  Discussed we are here to support her in any way until then or if she continues to return.

## 2023-05-20 LAB — MICROALBUMIN / CREATININE URINE RATIO
Creatinine, Urine: 18.5 mg/dL
Microalb/Creat Ratio: <16 mg/g{creat} (ref 0–29)
Microalbumin, Urine: <3 ug/mL

## 2023-05-20 LAB — LIPID PANEL W/O CHOL/HDL RATIO
Cholesterol, Total: 191 mg/dL (ref 100–199)
HDL: 53 mg/dL (ref >39)
LDL Chol Calc (NIH): 105 mg/dL — ABNORMAL HIGH (ref 0–99)
Triglycerides: 194 mg/dL — ABNORMAL HIGH (ref 0–149)
VLDL Cholesterol Cal: 33 mg/dL (ref 5–40)

## 2023-05-20 LAB — COMPREHENSIVE METABOLIC PANEL
ALT: 13 [IU]/L (ref 0–32)
AST: 16 [IU]/L (ref 0–40)
Albumin: 4.2 g/dL (ref 3.9–4.9)
Alkaline Phosphatase: 101 [IU]/L (ref 44–121)
BUN/Creatinine Ratio: 27 — ABNORMAL HIGH (ref 9–23)
BUN: 17 mg/dL (ref 6–24)
Bilirubin Total: 0.9 mg/dL (ref 0.0–1.2)
CO2: 20 mmol/L (ref 20–29)
Calcium: 9.6 mg/dL (ref 8.7–10.2)
Chloride: 99 mmol/L (ref 96–106)
Creatinine, Ser: 0.63 mg/dL (ref 0.57–1.00)
Globulin, Total: 2.7 g/dL (ref 1.5–4.5)
Glucose: 242 mg/dL — ABNORMAL HIGH (ref 70–99)
Potassium: 3.9 mmol/L (ref 3.5–5.2)
Sodium: 137 mmol/L (ref 134–144)
Total Protein: 6.9 g/dL (ref 6.0–8.5)
eGFR: 110 mL/min/{1.73_m2} (ref >59)

## 2023-05-20 LAB — HGB A1C W/O EAG: Hgb A1c MFr Bld: 12.8 % — ABNORMAL HIGH (ref 4.8–5.6)

## 2023-08-11 ENCOUNTER — Ambulatory Visit: Payer: Self-pay | Admitting: Nurse Practitioner

## 2023-09-18 ENCOUNTER — Other Ambulatory Visit: Payer: Self-pay | Admitting: Internal Medicine
